# Patient Record
Sex: Male | Born: 1960 | Race: Black or African American | Hispanic: No | Marital: Married | Smoking: Never smoker
Health system: Southern US, Community
[De-identification: ages and names within clinical notes are randomized; demographics above are authoritative.]

## PROBLEM LIST (undated history)

## (undated) DIAGNOSIS — J302 Other seasonal allergic rhinitis: Secondary | ICD-10-CM

## (undated) DIAGNOSIS — F329 Major depressive disorder, single episode, unspecified: Secondary | ICD-10-CM

## (undated) DIAGNOSIS — F419 Anxiety disorder, unspecified: Secondary | ICD-10-CM

## (undated) DIAGNOSIS — M5430 Sciatica, unspecified side: Secondary | ICD-10-CM

## (undated) DIAGNOSIS — F32A Depression, unspecified: Secondary | ICD-10-CM

## (undated) DIAGNOSIS — Z86010 Personal history of colonic polyps: Principal | ICD-10-CM

## (undated) HISTORY — DX: Personal history of colonic polyps: Z86.010

## (undated) HISTORY — DX: Other seasonal allergic rhinitis: J30.2

## (undated) HISTORY — DX: Depression, unspecified: F32.A

## (undated) HISTORY — DX: Major depressive disorder, single episode, unspecified: F32.9

## (undated) HISTORY — DX: Sciatica, unspecified side: M54.30

## (undated) HISTORY — DX: Anxiety disorder, unspecified: F41.9

## (undated) HISTORY — PX: OTHER SURGICAL HISTORY: SHX169

---

## 2012-07-18 ENCOUNTER — Ambulatory Visit: Payer: Self-pay | Admitting: Nurse Practitioner

## 2013-07-28 ENCOUNTER — Telehealth: Payer: Self-pay | Admitting: Nurse Practitioner

## 2013-07-28 NOTE — Telephone Encounter (Signed)
Patient needs soon appt--having pain on rt foot going up her leg--please call-thank you.

## 2013-07-29 NOTE — Telephone Encounter (Signed)
Called pt to sched an appt with Hoyle Sauer, NP on 08/06/13.  I advised the pt that if he has any other problems, questions or concerns to call the office. Pt verbalized understanding.

## 2013-08-05 ENCOUNTER — Encounter: Payer: Self-pay | Admitting: Nurse Practitioner

## 2013-08-06 ENCOUNTER — Encounter: Payer: Self-pay | Admitting: Nurse Practitioner

## 2013-08-06 ENCOUNTER — Encounter (INDEPENDENT_AMBULATORY_CARE_PROVIDER_SITE_OTHER): Payer: Self-pay

## 2013-08-06 ENCOUNTER — Ambulatory Visit (INDEPENDENT_AMBULATORY_CARE_PROVIDER_SITE_OTHER): Payer: BC Managed Care – PPO | Admitting: Nurse Practitioner

## 2013-08-06 VITALS — BP 99/61 | HR 70 | Ht 67.5 in | Wt 168.0 lb

## 2013-08-06 DIAGNOSIS — G5720 Lesion of femoral nerve, unspecified lower limb: Secondary | ICD-10-CM

## 2013-08-06 DIAGNOSIS — R209 Unspecified disturbances of skin sensation: Secondary | ICD-10-CM

## 2013-08-06 DIAGNOSIS — R200 Anesthesia of skin: Secondary | ICD-10-CM | POA: Insufficient documentation

## 2013-08-06 DIAGNOSIS — G5700 Lesion of sciatic nerve, unspecified lower limb: Secondary | ICD-10-CM | POA: Insufficient documentation

## 2013-08-06 NOTE — Progress Notes (Signed)
I agree with the assessment and plan as directed by NP .The patient is known to me .   Jerlyn Pain, MD  

## 2013-08-06 NOTE — Progress Notes (Signed)
[facility]  PATIENT: Robert Benitez DOB: 1960/04/30   REASON FOR VISIT: follow up for right thigh pain   HISTORY OF PRESENT ILLNESS: Robert Benitez, 53 year old male returns for followup. He has a history of right thigh pain with a burning shooting quality. He was last seen in this office 04/18/2012 since that time his pain is intermittent and is more in the calf area on the right side now. Sometimes he has pain around his knee. He rubs his calf the pain goes away briefly but returns. 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 Mild spondylitic changes at L5-S1. EMG/[place] on 03/07/12 without evidence of large fiber peripheral neuropathy or right lumbosacral radiculopathy. He never had labs  done for further testing.  He did not find gabapentin to be beneficial however he never titrated the medication beyond 100 mg.He returns for reevaluation  HISTORY: He used to have an L4-5 radiculopathy on the right  six years ago and  this  attenuated  his patella reflex, but this now is different. He was diagnosed with sciatica.   Now, he has a numbness and tingling sensation in the right buttock and tingling to the leg to the lateral side of the foot, L6-S1.   There may be two nerves involved now.   He mentioned that during winter he has some spinal spasms as if radiating  downwards from nuchal area to the lower back.  A feeling of stiffness  resulted, and was getting better after he changed  to a firmer mattress.  04/18/12. Patient returns for followup.  He continues with  a numbness and tingling sensation in the right buttock and tingling to the leg to the lateral side of the right knee. He was on very low dose Gabapentin 100mg  without benefit , he stopped the drug. 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 Mild spondylitic  changes at L5-S1. EMG/[place] on 03/07/12 without evidence of large fiber peripheral neuropathy or right lumbosacral radiculopathy.     REVIEW OF SYSTEMS: Full 14 system review of systems performed and notable only for those listed, all others are neg:  Constitutional: N/A  Cardiovascular: N/A  Ear/Nose/Throat: N/A  Skin: N/A  Eyes: N/A  Respiratory: N/A  Gastroitestinal: N/A  Hematology/Lymphatic: N/A  Endocrine: N/A Musculoskeletal: Knee pain Allergy/Immunology: N/A  Neurological: Burning pain inner thigh and  right calf muscle Psychiatric: N/A Sleep : NA   ALLERGIES: No Known Allergies  HOME MEDICATIONS: Outpatient Prescriptions Prior to Visit  Medication Sig Dispense Refill  . B Complex-C (B-COMPLEX WITH VITAMIN C) tablet Take 1 tablet by mouth daily.      [name] gabapentin (NEURONTIN) 100 MG capsule Take 100 mg by mouth 2 (two) times daily.       No facility-administered medications prior to visit.    PAST MEDICAL HISTORY: Past Medical History  Diagnosis Date  . Depression   . Anxiety   . Sciatica     PAST SURGICAL HISTORY: Past Surgical History  Procedure Laterality Date  . None listed      FAMILY HISTORY: Family History  Problem Relation Age of Onset  . Asthma    . Diabetes    . Diabetes Father     SOCIAL HISTORY: History   Social History  . Marital Status: Married    Spouse Name: N/A    Number of Children: 3  .  Years of Education: 12+   Occupational History  . Counselor  [employer]   Social History Main Topics  . Smoking status: Never Smoker   . Smokeless tobacco: Never Used  . Alcohol Use: No  . Drug Use: No  . Sexual Activity: Not on file   Other Topics Concern  . Not on file   Social History Narrative   Patient is a [place] male married with 3 children.    Patient has a Master's degree.    Patient is a Social worker at [employer]            PHYSICAL EXAM  Filed Vitals:   08/06/13 0951  BP: 99/61  Pulse: 70  Height: 5' 7.5"  (1.715 m)  Weight: 168 lb (76.204 kg)   Body mass index is 25.91 kg/(m^2).  Generalized: Well developed, in no acute distress  Head: normocephalic and atraumatic,. Oropharynx benign  Neck: Supple, no carotid bruits  Cardiac: Regular rate rhythm, no murmur  Musculoskeletal: No deformity   Neurological examination   Mentation: Alert oriented to time, place, history taking. Follows all commands speech and language fluent  Cranial nerve II-XII: Pupils were equal round reactive to light extraocular movements were full, visual field were full on confrontational test. Facial sensation and strength were normal. hearing was intact to finger rubbing bilaterally. Uvula tongue midline. head turning and shoulder shrug were normal and symmetric.Tongue protrusion into cheek strength was normal. Motor: normal bulk and tone, full strength in the BUE, BLE, fine finger movements normal, No focal weakness Sensory: normal and symmetric to light touch, pinprick, and  vibration in the upper and lower extremities Coordination: finger-nose-finger, heel-to-shin bilaterally, no dysmetria Reflexes: Brachioradialis 2/2, biceps 2/2, triceps 2/2, patellar 2/2, Achilles 2/2, plantar responses were flexor bilaterally. Gait and Station: Rising up from seated position without assistance, normal stance,  moderate stride, good arm swing, smooth turning, able to perform tiptoe, and heel walking without difficulty. Tandem gait is steady, no assistive device  DIAGNOSTIC DATA (LABS, IMAGING, TESTING) -ASSESSMENT AND PLAN  53 y.o. 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 He also has a burning pain in his right calf.EMG and [facility]  on 03/07/12 without evidence of large fiber peripheral neuropathy or right lumbosacral radiculopathy.   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 Mild spondylitic changes at L5-S1. Patient never had labs to assess for neuropathy.   Will check labs today for neuropathy Followup in 3-6 months Robert Benitez, [facility], [facility], [facility] [address] [place], [place] [address] [phone]

## 2013-08-06 NOTE — Patient Instructions (Signed)
Will check labs today for neuropathy Followup in 3-6 months

## 2013-08-07 NOTE — Progress Notes (Signed)
I agree with the assessment and plan as directed by NP .The patient is known to me .   Briya Lookabaugh, MD  

## 2013-08-08 LAB — PROTEIN ELECTROPHORESIS
A/G Ratio: 1.4 (ref 0.7–2.0)
ALPHA 1: 0.2 g/dL (ref 0.1–0.4)
ALPHA 2: 0.6 g/dL (ref 0.4–1.2)
Albumin ELP: 3.9 g/dL (ref 3.2–5.6)
Beta: 1.1 g/dL (ref 0.6–1.3)
Gamma Globulin: 1 g/dL (ref 0.5–1.6)
Globulin, Total: 2.8 g/dL (ref 2.0–4.5)
TOTAL PROTEIN: 6.7 g/dL (ref 6.0–8.5)

## 2013-08-08 LAB — ANGIOTENSIN CONVERTING ENZYME: ANGIO CONVERT ENZYME: 53 U/L (ref 14–82)

## 2013-08-08 LAB — VITAMIN B12: VITAMIN B 12: 967 pg/mL — AB (ref 211–946)

## 2013-08-08 LAB — TSH: TSH: 0.82 u[IU]/mL (ref 0.450–4.500)

## 2013-08-08 LAB — SEDIMENTATION RATE: Sed Rate: 2 mm/hr (ref 0–30)

## 2013-08-08 LAB — VITAMIN D 25 HYDROXY (VIT D DEFICIENCY, FRACTURES): Vit D, 25-Hydroxy: 29.9 ng/mL — ABNORMAL LOW (ref 30.0–100.0)

## 2013-10-06 ENCOUNTER — Telehealth: Payer: Self-pay | Admitting: Nurse Practitioner

## 2013-10-06 NOTE — Telephone Encounter (Signed)
Pt requesting an earlier appointment with CM (9/30).  Pain has shifted from thigh to knee and below calf muscle.  Please call and advise.

## 2013-10-06 NOTE — Telephone Encounter (Signed)
Called pt and left message informing pt to call our office back to set up a sooner appt with Hoyle Sauer, NP.

## 2013-10-23 NOTE — Telephone Encounter (Signed)
Noted  

## 2013-11-03 ENCOUNTER — Ambulatory Visit (INDEPENDENT_AMBULATORY_CARE_PROVIDER_SITE_OTHER): Payer: BC Managed Care – PPO | Admitting: Nurse Practitioner

## 2013-11-03 ENCOUNTER — Encounter: Payer: Self-pay | Admitting: Nurse Practitioner

## 2013-11-03 VITALS — BP 108/62 | HR 72 | Ht 67.5 in | Wt 173.8 lb

## 2013-11-03 DIAGNOSIS — R200 Anesthesia of skin: Secondary | ICD-10-CM

## 2013-11-03 DIAGNOSIS — G5700 Lesion of sciatic nerve, unspecified lower limb: Secondary | ICD-10-CM

## 2013-11-03 DIAGNOSIS — G5701 Lesion of sciatic nerve, right lower limb: Secondary | ICD-10-CM

## 2013-11-03 DIAGNOSIS — R209 Unspecified disturbances of skin sensation: Secondary | ICD-10-CM

## 2013-11-03 NOTE — Progress Notes (Addendum)
[facility]  PATIENT: Karon Lukehart DOB: 1960/07/15   REASON FOR VISIT: Followup for right thigh pain    HISTORY OF PRESENT ILLNESS: Mr.Iorio, 53 year old male returns for followup. He has a history of right thigh pain with a burning shooting quality. When last seen 08/06/2013 neuropathy labs were done, all were normal except mildly low vitamin D level. He reports today that his thigh pain is gone and now it is more in the right lower calf that comes and goes. It hasn't been a problem in the last several weeks. He is not sure if he is taking vitamin D in his multivitamin or  not.   HISTORY:Mr. Emma, 53 year old male returns for followup. He has a history of right thigh pain with a burning shooting quality. He was last seen in this office 04/18/2012 since that time his pain is intermittent and is more in the calf area on the right side now. Sometimes he has pain around his knee. He rubs his calf the pain goes away briefly but returns. 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 Mild spondylitic changes at L5-S1. EMG/[place] on 03/07/12 without evidence of large fiber peripheral neuropathy or right lumbosacral radiculopathy. He never had labs done for further testing. He did not find gabapentin to be beneficial however he never titrated the medication beyond 100 mg.He returns for reevaluation   He used to have an L4-5 radiculopathy on the right six years ago and this attenuated his patella reflex, but this now is different. He was diagnosed with sciatica.  Now, he has a numbness and tingling sensation in the right buttock and tingling to the leg to the lateral side of the foot, L6-S1. There may be two nerves involved now.  He mentioned that during winter he has some spinal spasms as if radiating downwards from nuchal area to the lower back. A feeling of stiffness resulted, and was getting  better after he changed to a firmer mattress.  04/18/12. Patient returns for followup. He continues with a numbness and tingling sensation in the right buttock and tingling to the leg to the lateral side of the right knee. He was on very low dose Gabapentin 100mg  without benefit , he stopped the drug. 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 Mild spondylitic changes at L5-S1. EMG/[place] on 03/07/12 without evidence of large fiber peripheral neuropathy or right lumbosacral radiculopathy.      REVIEW OF SYSTEMS: Full 14 system review of systems performed and notable only for those listed, all others are neg:  Constitutional: N/A  Cardiovascular: N/A  Ear/Nose/Throat: N/A  Skin: N/A  Eyes: N/A  Respiratory: N/A  Gastroitestinal: N/A  Hematology/Lymphatic: N/A  Endocrine: N/A Musculoskeletal:N/A  Allergy/Immunology: N/A  Neurological: N/A Psychiatric: N/A Sleep : NA   ALLERGIES: No Known Allergies  HOME MEDICATIONS: Outpatient Prescriptions Prior to Visit  Medication Sig Dispense Refill  . B Complex-C (B-COMPLEX WITH VITAMIN C) tablet Take 1 tablet by mouth daily.       No facility-administered medications prior to visit.    PAST MEDICAL HISTORY: Past Medical History  Diagnosis Date  . Depression   . Anxiety   . Sciatica     PAST SURGICAL HISTORY: Past Surgical History  Procedure Laterality Date  . None listed      FAMILY HISTORY: Family History  Problem Relation Age of Onset  .  Asthma    . Diabetes    . Diabetes Father     SOCIAL HISTORY: History   Social History  . Marital Status: Married    Spouse Name: N/A    Number of Children: 3  . 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 Smoking status: Never Smoker   . Smokeless tobacco: Never Used  . Alcohol Use: No  . Drug Use: No  . Sexual Activity: Not  on file   Other Topics Concern  . Not on file   Social History Narrative   Patient is a [place] male married with 3 children.    Patient has a Master's degree.    Patient is a Social worker at [employer]            PHYSICAL EXAM  Filed Vitals:   11/03/13 1530  BP: 108/62  Pulse: 72  Height: 5' 7.5" (1.715 m)  Weight: 173 lb 12.8 oz (78.835 kg)   Body mass index is 26.8 kg/(m^2). Generalized: Well developed, in no acute distress  Musculoskeletal: No deformity  Neurological examination  Mentation: Alert oriented to time, place, history taking. Follows all commands speech and language fluent  Cranial nerve II-XII: Pupils were equal round reactive to light extraocular movements were full, visual field were full on confrontational test. Facial sensation and strength were normal. hearing was intact to finger rubbing bilaterally. Uvula tongue midline. head turning and shoulder shrug were normal and symmetric.Tongue protrusion into cheek strength was normal.  Motor: normal bulk and tone, full strength in the BUE, BLE, fine finger movements normal, No focal weakness  Sensory: normal and symmetric to light touch, pinprick, and vibration in the upper and lower extremities  Coordination: finger-nose-finger, heel-to-shin bilaterally, no dysmetria  Reflexes: Brachioradialis 2/2, biceps 2/2, triceps 2/2, patellar 2/2, Achilles 2/2, plantar responses were flexor bilaterally.  Gait and Station: Rising up from seated position without assistance, normal stance, moderate stride, good arm swing, smooth turning, able to perform tiptoe, and heel walking without difficulty. Tandem gait is steady, no assistive device     FDIAGNOSTIC DATA (LABS, IMAGING, TESTING) - I reviewed patient records, labs, notes, testing and imaging myself where available.      Component Value Date/Time   PROT 6.7 08/06/2013 1040    Lab Results  Component Value Date   [id number] 287* 08/06/2013   Lab Results  Component Value Date    TSH 0.820 08/06/2013      ASSESSMENT AND PLAN  53 y.o. year old male  has a past medical history of Depression; Anxiety; and Sciatica. here to followup. His right back pain is much better but now has intermittently pain  in the right calf.  Vit D 1000 IU daily No follow up necessary with MD - C. Dohmeier, MD  Exercise by walking daily for overall general health Patient has tried gabapentin in the past and does not want to be on any daily medication. [name], [facility], [facility], APRN  [facility] [address] [place], [place] [address] [phone]

## 2013-11-03 NOTE — Patient Instructions (Signed)
Vit D 1000 IU daily F/U in 6 months Exercise by walking daily for overall general health

## 2013-12-17 ENCOUNTER — Ambulatory Visit: Payer: BC Managed Care – PPO | Admitting: Nurse Practitioner

## 2014-04-27 ENCOUNTER — Telehealth: Payer: Self-pay

## 2014-04-27 NOTE — Telephone Encounter (Signed)
Called to r/s appt on 05/06/14, due to change in provider's schedule. Patient verbalized understanding. Patient stated he was currently with a student and would have to call back to reschedule.

## 2014-05-06 ENCOUNTER — Ambulatory Visit: Payer: BC Managed Care – PPO | Admitting: Nurse Practitioner

## 2014-05-15 ENCOUNTER — Encounter: Payer: Self-pay | Admitting: Neurology

## 2014-05-15 ENCOUNTER — Ambulatory Visit (INDEPENDENT_AMBULATORY_CARE_PROVIDER_SITE_OTHER): Payer: BLUE CROSS/BLUE SHIELD | Admitting: Neurology

## 2014-05-15 VITALS — BP 116/71 | HR 75 | Resp 14 | Ht 67.0 in | Wt 172.0 lb

## 2014-05-15 DIAGNOSIS — M47896 Other spondylosis, lumbar region: Secondary | ICD-10-CM

## 2014-05-15 DIAGNOSIS — R208 Other disturbances of skin sensation: Secondary | ICD-10-CM | POA: Diagnosis not present

## 2014-05-15 NOTE — Progress Notes (Signed)
[facility]  PATIENT: Robert Benitez DOB: Dec 15, 1960   REASON FOR VISIT: Followup for right thigh pain    HISTORY OF PRESENT ILLNESS: Robert Benitez, 54 year old male returns for followup. He has a history of right thigh pain with a burning shooting quality. When last seen 08/06/2013 neuropathy labs were done, all were normal except mildly low vitamin D level. He reports today that his thigh pain is gone and now it is more in the right lower calf that comes and goes. It hasn't been a problem in the last several weeks. He is not sure if he is taking vitamin D in his multivitamin or  not.   HISTORY:Robert Benitez, 54 year old male returns for followup. He has a history of right thigh pain with a burning shooting quality. He was last seen in this office 04/18/2012 since that time his pain is intermittent and is more in the calf area on the right side now. Sometimes he has pain around his knee. He rubs his calf the pain goes away briefly but returns. 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 Mild spondylitic changes at L5-S1. EMG/[place] on 03/07/12 without evidence of large fiber peripheral neuropathy or right lumbosacral radiculopathy. He never had labs done for further testing. He did not find gabapentin to be beneficial however he never titrated the medication beyond 100 mg.He returns for reevaluation   He used to have an L4-5 radiculopathy on the right six years ago and this attenuated his patella reflex, but this now is different. He was diagnosed with sciatica.  Now, he has a numbness and tingling sensation in the right buttock and tingling to the leg to the lateral side of the foot, L6-S1. There may be two nerves involved now.  He mentioned that during winter he has some spinal spasms as if radiating downwards from nuchal area to the lower back. A feeling of stiffness resulted, and was getting  better after he changed to a firmer mattress.  04/18/12. Patient returns for followup. He continues with a numbness and tingling sensation in the right buttock and tingling to the leg to the lateral side of the right knee. He was on very low dose Gabapentin 100mg  without benefit , he stopped the drug. 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 Mild spondylitic changes at L5-S1. EMG/[place] on 03/07/12 without evidence of large fiber peripheral neuropathy or right lumbosacral radiculopathy.  His last visit concluded as follows:  ASSESSMENT AND PLAN  54 y.o. year old male  has a past medical history of Depression; Anxiety; and Sciatica. here to followup.  His right back pain is much better but now has intermittently pain  in the right calf. Vit D 1000 IU daily No follow up necessary with MD - C. Javonte Elenes, MD  Exercise by walking daily for overall general health Patient has tried gabapentin in the past and does not want to be on any daily medication.  Patient requested new Visit with a new symptom, 05-15-14,   Robert Benitez is seen here today in a follow-up for a development of dysesthesia. He was tested about 2 years ago by EMG and nerve conduction studies which returned negative for large fiber peripheral neuropathy or right lumbosacral radiculopathy he had an MRI of the lumbar spine which showed only moderate spondylitic changes at L4-5 the most commonly affected levels. He had bilaminar  bilateral foraminal stenosis but no root impingement.Robert Benitez He now has a new symptom that is concerning him very much. He describes that there is an anterior medial patch at the left inner ankle that gives him a burning sensation. He has not found that exposure, time of day, fatigability place a role in the onset it seems to affect him if he was on his feet a lot walking or standing or even when he is at rest. It is unaffected by mealtimes or time  of day it is even present when he is asleep and otherwise completely at rest. Reports a crescent or horseshoe-shaped area alongside the upper kneecap that can be burning like a low-grade painful sensation. Has never radiated upwards there is another area which we discussed in the previous visit of pain traveling along the groin from the pelvic crest down the right groin towards the midline. The knee pain area is most painful when he exercises and stretches/ flexes at the knee . He can comfortably stand on either leg, he ran the marathon at valentine's day , only 5 K, but for a week felt generalized muscle. He wore support stocking for 14 days , has seen no difference/ in pain or soreness.  He has has sciatica in the past, not affecting him now. He lost the radiculopathic L4-5 pain a while back.    I suspect this could be the  saphenous nerve ? He has no edema, no rash, no cyanosis or coldness. There is no asymmetry in muscle bulk or bony structure of either leg. His knees are of the same size he does have some scars on the anterior shin but there is no edema cyanosis no coldness his skin is slightly dry. He gained some weight, but he has been exercising regularly.    REVIEW OF SYSTEMS: Full 14 system review of systems performed and notable only for those listed, all others are neg:  See above. He has endorsed a lot of work place stress.  He is on a [employer]. He has 3 kids in school.    ALLERGIES: No Known Allergies  HOME MEDICATIONS: Outpatient Prescriptions Prior to Visit  Medication Sig Dispense Refill  . Ascorbic Acid (VITAMIN C) 500 MG CAPS Take by mouth.    . B Complex-C (B-COMPLEX WITH VITAMIN C) tablet Take 1 tablet by mouth daily.    . Multiple Vitamin (THERA) TABS Take 1 tablet by mouth.     No facility-administered medications prior to visit.    PAST MEDICAL HISTORY: Past Medical History  Diagnosis Date  . Depression   . Anxiety   . Sciatica     PAST SURGICAL  HISTORY: Past Surgical History  Procedure Laterality Date  . None listed      FAMILY HISTORY: Family History  Problem Relation Age of Onset  . Asthma    . Diabetes    . Diabetes Father     SOCIAL HISTORY: History   Social History  . Marital Status: Married    Spouse Name: N/A  . Number of Children: 3  . Years of Education: 12+   Occupational History  . Counselor  [employer]   Social History Main Topics  . Smoking status: Never Smoker   . Smokeless tobacco: Never Used  . Alcohol Use: No  . Drug Use: No  . Sexual Activity: Not on file   Other Topics Concern  . Not on file   Social History Narrative   Patient is a [place]  male married with 3 children. Patient wife Beverlee Nims    Patient has a Master's degree.    Patient is a Social worker at [employer]            PHYSICAL EXAM  Filed Vitals:   05/15/14 1023  BP: 116/71  Pulse: 75  Resp: 14  Height: 5\' 7"  (1.702 m)  Weight: 172 lb (78.019 kg)   Body mass index is 26.93 kg/(m^2). Generalized: Well developed, in no acute distress  Musculoskeletal: No deformity  Neurological examination  Mentation: Alert oriented to time, place, history taking. Follows all commands speech and language fluent  Cranial nerve :  Pupils were equal round reactive to light extraocular movements were full, visual field were full on confrontational test. Facial sensation and strength were normal. hearing was intact to finger rubbing bilaterally. Uvula tongue midline. head turning and shoulder shrug were normal and symmetric.Tongue protrusion into cheek strength was normal.  Motor: normal bulk and tone, full strength in all extremities, fine finger movements normal, No focal weakness  Sensory: normal and symmetric to light touch, pinprick, and vibration in the upper and lower extremities  Coordination: finger-nose-finger, heel-to-shin bilaterally, no dysmetria  Reflexes: 2/2, plantar responses were flexor bilaterally.  Gait and Station: Rising  up from seated position without assistance, normal stance, moderate stride, good arm swing, smooth turning, able to perform tiptoe, and heel walking without difficulty. Tandem gait is steady, no assistive device . He can stand on either leg for 10 sec.     FDIAGNOSTIC DATA (LABS, IMAGING, TESTING) - I reviewed patient records, labs, notes, testing and imaging myself where available.   quoted EMG/ [facility] above     Component Value Date/Time   PROT 6.7 08/06/2013 1040    Lab Results  Component Value Date   [id number] 878* 08/06/2013   Lab Results  Component Value Date   TSH 0.820 08/06/2013      Dr. Kelby Fam somatic manifestations have evolved slightly since his last visit. Considering that about 6 or 7 years ago he had lost his patella reflex and was diagnosed with sciatica he seems to have done well in terms of that part of his neurologic manifestations. In January 2014 there was no evidence of large fiber peripheral neuropathy or lumbosacral radiculopathy. However this may have changed he has 3 distinctive regions of dysesthesias the groin the superior and medial knee Area and the dorsal pedis around the ankle medial and frontal. All these are and branches of the L4-L5 region. I think it would be worthwhile for this patient to look at a new EMG and nerve conduction study to make sure that no large fiber involvement is seen now.  I will request a paraspinal EMG from my neuromuscular colleagues.   I will refer him to  A male [facility] , either Dr. Leta Baptist or Dr. Jannifer Franklin was EMG and nerve conduction study. As treatment options we have gabapentin for neuropathy, Lyrica, or topical numbing creams or ointments.  I do think that he is physically very active for his numeric age and that this does not constitute at condition that is degenerative, progressive, or related to any malignancy. He wil take Ibuprofen for now.    [facility] [address], [place], [place]  [address] [phone]

## 2014-06-01 ENCOUNTER — Ambulatory Visit (INDEPENDENT_AMBULATORY_CARE_PROVIDER_SITE_OTHER): Payer: BLUE CROSS/BLUE SHIELD | Admitting: Neurology

## 2014-06-01 ENCOUNTER — Ambulatory Visit (INDEPENDENT_AMBULATORY_CARE_PROVIDER_SITE_OTHER): Payer: Self-pay | Admitting: Neurology

## 2014-06-01 DIAGNOSIS — R202 Paresthesia of skin: Secondary | ICD-10-CM | POA: Diagnosis not present

## 2014-06-01 DIAGNOSIS — M47896 Other spondylosis, lumbar region: Secondary | ICD-10-CM

## 2014-06-01 DIAGNOSIS — R208 Other disturbances of skin sensation: Secondary | ICD-10-CM

## 2014-06-04 NOTE — Procedures (Signed)
  [facility]    Provider:  Dr Jaynee Eagles Referring Provider: Beam, Russ Halo, PHD Primary Care Physician:  BEAM, Russ Halo, PHD  HPI:  Robert Benitez is a 54 y.o. male here as a referral from Dr. Shelda Pal for right leg pain. He has had right-sided sciatica for 20 years with radiation down the back of the leg to the lateral right lower leg. However his current concern is burning pain that radiates to the right anterior thigh area and into the groin. The worse pain is focused around the knee in a "horse-shoe" distribution. These symptoms have been progressing for 3 years but more recently his thigh is "a little quieter". In addition, 8 months ago he noticed burning in the medial right lower leg.   Summary  Nerve conduction studies were performed on the lower extremities:  The left Peroneal motor nerve showed normal conductions with normal F Wave latency The right Peroneal motor nerve showed  conduction block (>50% decrease in amplitude with proximal conduction as compared to the distal amplitudes) with normal F Wave latency The bilateral Tibial motor nerves showed normal conductions with normal F Wave latencies The bilateral Sural, Superficial Peroneal, and Saphenous sensory nerves were within normal limits Bilateral H Reflexes showed normal latencies  EMG Needle study was performed on selected right lower extremity muscles and paraspinal muscles:   The  Iliopsoas, Adductor Magnus,Vastus Medialis, Anterior Tibialis, Medial Gastrocnemius, Peroneus Longus, Extensor Hallucis Longus, Abductor Hallucis, Biceps Femoris (long and short heads), Gluteus Medius, Gluteus Maximus muscles were within normal limits. The right L3, L4, L5, S1 paraspinal muscles were within normal limits.  Conclusion:   1. No electrophysiologic findings to explain his right anterior thigh sensations or his right medial lower leg paresthesias. No evidence for acute/ongoing radiculopathy, plexopathy or polyneuropathy.    2. 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 However, this does not explain his clinical symptoms and so appears to be more of an incidental finding. The relative decrease in right peroneal motor amplitude potential as compared to the left side could be  consistent with his remote history of sciatica.  3. Clinical correlation recommended.    Sarina Ill, MD  [facility] [address] [place], [place] [address]  Phone [phone] Fax [phone]

## 2014-06-04 NOTE — Progress Notes (Signed)
  [facility]    Provider:  Dr Jaynee Eagles Referring Provider: Beam, Russ Halo, PHD Primary Care Physician:  BEAM, Russ Halo, PHD  HPI:  Robert Benitez is a 54 y.o. male here as a referral from Dr. Shelda Pal for right leg pain. He has had right-sided sciatica for 20 years with radiation down the back of the leg to the lateral right lower leg. However his current concern is burning pain that radiates to the right anterior thigh area and into the groin. The worse pain is focused around the knee in a "horse-shoe" distribution. These symptoms have been progressing for 3 years but more recently his thigh is "a little quieter". In addition, 8 months ago he noticed burning in the medial right lower leg.   Summary  Nerve conduction studies were performed on the lower extremities:  The left Peroneal motor nerve showed normal conductions with normal F Wave latency The right Peroneal motor nerve showed  conduction block (>50% decrease in amplitude with proximal conduction as compared to the distal amplitudes) with normal F Wave latency The bilateral Tibial motor nerves showed normal conductions with normal F Wave latencies The bilateral Sural, Superficial Peroneal, and Saphenous sensory nerves were within normal limits Bilateral H Reflexes showed normal latencies  EMG Needle study was performed on selected right lower extremity muscles and paraspinal muscles:   The  Iliopsoas, Adductor Magnus,Vastus Medialis, Anterior Tibialis, Medial Gastrocnemius, Peroneus Longus, Extensor Hallucis Longus, Abductor Hallucis, Biceps Femoris (long and short heads), Gluteus Medius, Gluteus Maximus muscles were within normal limits. The right L3, L4, L5, S1 paraspinal muscles were within normal limits.  Conclusion:   1. No electrophysiologic findings to explain his right anterior thigh sensations or his right medial lower leg paresthesias. No evidence for acute/ongoing radiculopathy, plexopathy or polyneuropathy.    2. 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 However, this does not explain his clinical symptoms and so appears to be more of an incidental finding. The relative decrease in right peroneal motor amplitude potential as compared to the left side could be  consistent with his remote history of sciatica.  3. Clinical correlation recommended.    Sarina Ill, MD  [facility] [address] [place], [place] [address]  Phone [phone] Fax [phone]

## 2014-06-04 NOTE — Progress Notes (Signed)
See procedure note.

## 2014-06-05 NOTE — Progress Notes (Signed)
I agree with the assessment and plan as directed by NP .The patient is known to me .   Keaun Schnabel, MD  

## 2014-06-09 NOTE — Progress Notes (Signed)
Quick Note:  LMVM for pt re: result of EMG/NCS (result note). Pt to call back if questions. Please sign up for mychart for copy of results. ______

## 2014-06-18 ENCOUNTER — Ambulatory Visit: Payer: BLUE CROSS/BLUE SHIELD | Admitting: Adult Health

## 2014-06-18 ENCOUNTER — Ambulatory Visit (INDEPENDENT_AMBULATORY_CARE_PROVIDER_SITE_OTHER): Payer: BC Managed Care – PPO | Admitting: Nurse Practitioner

## 2014-06-18 ENCOUNTER — Encounter: Payer: Self-pay | Admitting: Nurse Practitioner

## 2014-06-18 VITALS — BP 107/70 | HR 79 | Ht 67.0 in | Wt 175.8 lb

## 2014-06-18 DIAGNOSIS — R200 Anesthesia of skin: Secondary | ICD-10-CM

## 2014-06-18 DIAGNOSIS — R208 Other disturbances of skin sensation: Secondary | ICD-10-CM | POA: Diagnosis not present

## 2014-06-18 NOTE — Patient Instructions (Signed)
Gabapentin 100 mg 2 capsules at night for one week then increase to 3 capsules at night. This medication is for nerve pain Follow-up in 6 months

## 2014-06-18 NOTE — Progress Notes (Signed)
[facility]  PATIENT: Robert Benitez DOB: January 21, 1961   REASON FOR VISIT: Follow-up for right leg pain, dysesthesias of the right ankle, right groin pain HISTORY FROM: Patient    HISTORY OF PRESENT ILLNESS:Robert Benitez, 54 year old male returns for followup. He was last seen by Dr. Brett Fairy 05/15/2014 He has a history of right thigh pain with a burning shooting quality. Neuropathy labs were done, all were normal except mildly low vitamin D level. He reports today that his thigh pain has returned in the right lower calf pain comes and goes. He is not currently having ankle pain. He also has some knee pain which is most painful when he exercises or stretches and flexes the knee. When last seen by Dr. Brett Fairy he was also having groin pain on the right. His discomfort is a burning sensation that comes and goes . He is not taking any medications currently for nerve pain. He returns for reevaluation EMG nerve conduction done 06/01/2014 was essentially normal ,results were reviewed with patient.  HISTORY:Robert Benitez, 54 year old male returns for followup. He has a history of right thigh pain with a burning shooting quality. He was last seen in this office 04/18/2012 since that time his pain is intermittent and is more in the calf area on the right side now. Sometimes he has pain around his knee. He rubs his calf the pain goes away briefly but returns. 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 Mild spondylitic changes at L5-S1. EMG/[place] on 03/07/12 without evidence of large fiber peripheral neuropathy or right lumbosacral radiculopathy.  He did not find gabapentin to be beneficial however he never titrated the medication beyond 100 mg.He returns for reevaluation  He used to have an L4-5 radiculopathy on the right six years ago and this attenuated his patella reflex, but this now is different. He was  diagnosed with sciatica.  Now, he has a numbness and tingling sensation in the right buttock and tingling to the leg to the lateral side of the foot, L6-S1. There may be two nerves involved now.  He mentioned that during winter he has some spinal spasms as if radiating downwards from nuchal area to the lower back. A feeling of stiffness resulted, and was getting better after he changed to a firmer mattress.  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 Mild spondylitic changes at L5-S1. EMG/ on 03/07/12 without evidence of large fiber peripheral neuropathy or right lumbosacral radiculopathy.   REVIEW OF SYSTEMS: Full 14 system review of systems performed and notable only for those listed, all others are neg:  Constitutional: neg  Cardiovascular: neg Ear/Nose/Throat: neg  Skin: neg Eyes: neg Respiratory: neg Gastroitestinal: neg  Hematology/Lymphatic: neg  Endocrine: neg Musculoskeletal:neg Allergy/Immunology: neg Neurological: neg Psychiatric: neg Sleep : neg   ALLERGIES: No Known Allergies  HOME MEDICATIONS: Outpatient Prescriptions Prior to Visit  Medication Sig Dispense Refill  . Ascorbic Acid (VITAMIN C) 500 MG CAPS Take by mouth.    . B Complex-C (B-COMPLEX WITH VITAMIN C) tablet Take 1 tablet by mouth daily.    . Multiple Vitamin (THERA) TABS Take 1 tablet by mouth.     No facility-administered medications prior to visit.    PAST MEDICAL HISTORY: Past Medical History  Diagnosis Date  . Depression   . Anxiety   . Sciatica     PAST SURGICAL HISTORY: Past Surgical  History  Procedure Laterality Date  . None listed      FAMILY HISTORY: Family History  Problem Relation Age of Onset  . Asthma    . Diabetes    . Diabetes Father     SOCIAL HISTORY: History   Social History  . Marital Status: Married    Spouse Name: N/A  . Number of Children: 3  . Years of Education: 12+     Occupational History  . Counselor  [employer]   Social History Main Topics  . Smoking status: Never Smoker   . Smokeless tobacco: Never Used  . Alcohol Use: No  . Drug Use: No  . Sexual Activity: Not on file   Other Topics Concern  . Not on file   Social History Narrative   Patient is a [place] male married with 3 children. Patient wife Beverlee Nims    Patient has a Master's degree.    Patient is a Social worker at [employer]            PHYSICAL EXAM  Filed Vitals:   06/18/14 0801  Height: 5\' 7"  (1.702 m)  Weight: 175 lb 12.8 oz (79.742 kg)   Body mass index is 27.53 kg/(m^2). Generalized: Well developed, in no acute distress  Musculoskeletal: No deformity  Neurological examination  Mentation: Alert oriented to time, place, history taking. Follows all commands speech and language fluent  Cranial nerve : Pupils were equal round reactive to light extraocular movements were full, visual field were full on confrontational test. Facial sensation and strength were normal. hearing was intact to finger rubbing bilaterally. Uvula tongue midline. head turning and shoulder shrug were normal and symmetric.Tongue protrusion into cheek strength was normal.  Motor: normal bulk and tone, full strength in all extremities, fine finger movements normal, No focal weakness  Sensory: normal and symmetric to light touch, pinprick, and vibration in the upper and lower extremities except mildly decreased pinprick on the right lower extremity Coordination: finger-nose-finger, heel-to-shin bilaterally, no dysmetria  Reflexes: 2/2, plantar responses were flexor bilaterally.  Gait and Station: Rising up from seated position without assistance, normal stance, moderate stride, good arm swing, smooth turning, able to perform tiptoe, and heel walking without difficulty. Tandem gait is steady, no assistive device .He can stand on either leg for 10 sec.   DIAGNOSTIC DATA (LABS, IMAGING,  TESTING) -  ASSESSMENT AND PLAN  54 y.o. year old male  has a past medical history of dysesthesias of the groin, of the superior and medial knee also the dorsal pedis around the ankle medial and frontal. EMG nerve conduction performed 06/01/2014 was essentially normal. He is continuing with these dysesthesias and is requesting medication to quiet the burning sensation  Try Gabapentin 100 mg 2 capsules at night for one week then increase to 3 capsules at night. This medication is for nerve pain Call for worsening symptoms Other medications that can be used are Lyrica or topical creams Follow-up in 6 months [name], [facility], [facility], [facility] [address], [place], [place] [address] [phone]

## 2014-06-18 NOTE — Progress Notes (Signed)
I agree with the assessment and plan as directed by NP .The patient is known to me .   Dezarae Mcclaran, MD  

## 2014-12-18 ENCOUNTER — Ambulatory Visit: Payer: BC Managed Care – PPO | Admitting: Nurse Practitioner

## 2014-12-22 ENCOUNTER — Ambulatory Visit: Payer: BC Managed Care – PPO | Admitting: Nurse Practitioner

## 2016-07-12 ENCOUNTER — Ambulatory Visit (INDEPENDENT_AMBULATORY_CARE_PROVIDER_SITE_OTHER): Payer: BC Managed Care – PPO | Admitting: Osteopathic Medicine

## 2016-07-12 ENCOUNTER — Encounter: Payer: Self-pay | Admitting: Osteopathic Medicine

## 2016-07-12 VITALS — BP 106/72 | HR 80 | Ht 67.0 in | Wt 176.0 lb

## 2016-07-12 DIAGNOSIS — M79604 Pain in right leg: Secondary | ICD-10-CM | POA: Diagnosis not present

## 2016-07-12 DIAGNOSIS — Z0000 Encounter for general adult medical examination without abnormal findings: Secondary | ICD-10-CM

## 2016-07-12 MED ORDER — MELOXICAM 7.5 MG PO TABS: 7.5000 mg | ORAL_TABLET | Freq: Every day | ORAL | 1 refills | Status: DC

## 2016-07-12 NOTE — Progress Notes (Signed)
HPI: Robert Benitez is a 56 y.o. male  who presents to [facility] today, 07/12/16,  for chief complaint of:  Chief Complaint  Patient presents with  . Establish Care    RIGHT LEG PAIN    Leg Pain  . Context: No hx injury to back/hip/knee/ankle on R. Previous w/u w/ EMG and MRI negative for explanation. Symptoms are not debilitating - he is a bit frustrated that neurology was just trying to give medications without really giving him a reason for symptoms.  . Location: R leg around thing/groin, ina  Semicircle above patella, and medical lower leg anterior to gastrocnemius  . Quality: tightness feeling, occasional burning/heat feeling . Severity: mild . Duration: years . Timing: intermittent, few days of the week . Modifying factors: Gabapentin tried and not helpful, OTC NSAID help some  Review of records from neurology visit 10/2013: "He had MRI of the lumbar spine shows moderate spondylitic changes at L4-L5 with bilateral foraminal stenosis but no root impingement. Mild spondylitic changes at L5-S1. EMG/ on 03/07/12 without evidence of large fiber peripheral neuropathy or right lumbosacral radiculopathy. He never had labs done for further testing. He did not find gabapentin to be beneficial however he never titrated the medication beyond 100 mg." A/P recommends walking for exercise and f/u 6 mos. Visit 05/2014 - gabapentin recommended, mentioned other emds such as Lyrica or topical treatment  Repeat EMG 05/2014: "1. No electrophysiologic findings to explain his right anterior thigh sensations or his right medial lower leg paresthesias. No evidence for acute/ongoing radiculopathy, plexopathy or polyneuropathy.  2. 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 However, this does not explain his clinical symptoms and so appears to be more of an incidental finding. The relative decrease in right peroneal motor amplitude potential as compared to the  left side could be  consistent with his remote history of sciatica.  3. Clinical correlation recommended."     Past medical, surgical, social and family history reviewed: Patient Active Problem List   Diagnosis Date Noted  . Other osteoarthritis of spine, lumbar region 05/15/2014  . Dysesthesia of multiple sites 05/15/2014  . Lesion of sciatic nerve 08/06/2013  . Other lesion of femoral nerve 08/06/2013  . Lower extremity numbness 08/06/2013   Past Surgical History:  Procedure Laterality Date  . None listed     Social History  Substance Use Topics  . Smoking status: Never Smoker  . Smokeless tobacco: Never Used  . Alcohol use No   Family History  Problem Relation Age of Onset  . Diabetes Father   . Asthma    . Diabetes       Current medication list and allergy/intolerance information reviewed:   Current Outpatient Prescriptions  Medication Sig Dispense Refill  . Multiple Vitamin (THERA) TABS Take 1 tablet by mouth.     No current facility-administered medications for this visit.    No Known Allergies    Review of Systems:  Constitutional:  No  fever, no chills, No recent illness, No unintentional weight changes. No significant fatigue.   HEENT: No  headache, no vision change  Cardiac: No  chest pain, No  pressure, No palpitations, No  Orthopnea  Respiratory:  No  shortness of breath. No  Cough  Gastrointestinal: No  abdominal pain, No  nausea,  Musculoskeletal: No new myalgia/arthralgia  Skin: No  Rash, No other wounds/concerning lesions  Hem/Onc: No  easy bruising/bleeding, node  Endocrine: No cold intolerance,  No heat intolerance.  Neurologic: No  weakness, No  dizziness, No  slurred speech/focal weakness/facial droop  Psychiatric: No  concerns with depression, No  concerns with anxiety, No sleep problems, No mood problems  Exam:  BP 106/72   Pulse 80   Ht 5\' 7"  (1.702 m)   Wt 176 lb (79.8 kg)   BMI 27.57 kg/m   Constitutional: VS see  above. General Appearance: alert, well-developed, well-nourished, NAD  Eyes: Normal lids and conjunctive, non-icteric sclera  Ears, Nose, Mouth, Throat: MMM, Normal external inspection ears/nares/mouth/lips/gums.   Neck: No masses, trachea midline.  Respiratory: Normal respiratory effort. no wheeze, no rhonchi, no rales  Cardiovascular: S1/S2 normal, no murmur, no rub/gallop auscultated. RRR. No lower extremity edema. Pedal pulse II/IV bilaterally DP and PT.   Gastrointestinal: Nontender, no masses.   Musculoskeletal: Fairly symmetric posture. No lower back tenderness to palpation, normal lumbar ROM. Gait normal. No LE edema, neg Homan's bilaterally. Strength 5/5 and equal bilaterally to hip flex/ext, knee flex/ext, ankle plantar/dorsiflexion and inversion/eversion. No crepitus to knee on R. Normal FABER. Neg McMurrays. Neg ant/post drawer.   Neurological: Normal balance/coordination. No tremor. No cranial nerve deficit on limited exam. Motor and sensation intact and symmetric. Cerebellar reflexes intact.   Skin: warm, dry, intact. No rash/ulcer. No concerning nevi or subq nodules on limited exam.    Psychiatric: Normal judgment/insight. Normal mood and affect. Oriented x3.     ASSESSMENT/PLAN:   Right leg pain - Not sure what to make of his symptoms, distribution/character not c/w nerve impingement, no weakness/sciatica - would get 2nd opinion from sports med/PT - longstanding issue and no alarm symptoms for urgent imaging or referral.   Annual physical exam - Orders placed fr future visit - preventive care not performed or billed today. Plan: CBC with Differential/Platelet, COMPLETE METABOLIC PANEL WITH GFR, Lipid panel, TSH, VITAMIN D 25 Hydroxy (Vit-D Deficiency, Fractures), Hepatitis C antibody, HIV antibody    Patient Instructions  Plan: Second opinion on leg pain from sports medicine - Dr. Georgina Snell or Dr. Darene Lamer - can schedule this for same day as your annual wellness physical.      Visit summary with medication list and pertinent instructions was printed for patient to review. All questions at time of visit were answered - patient instructed to contact office with any additional concerns. ER/RTC precautions were reviewed with the patient. Follow-up plan: Return for second opinion w/ sports med, annual physical w/ Dr Sheppard Coil .  Note: Total time spent 30 minutes, greater than 50% of the visit was spent face-to-face counseling and coordinating care for the following: The primary encounter diagnosis was Right leg pain.

## 2016-07-12 NOTE — Patient Instructions (Signed)
Plan: Second opinion on leg pain from sports medicine - Dr. Georgina Snell or Dr. Darene Lamer - can schedule this for same day as your annual wellness physical.

## 2016-07-14 LAB — COMPLETE METABOLIC PANEL WITH GFR
AG Ratio: 1.5 Ratio (ref 1.0–2.5)
ALT: 17 U/L (ref 9–46)
AST: 15 U/L (ref 10–35)
Albumin: 3.9 g/dL (ref 3.6–5.1)
Alkaline Phosphatase: 54 U/L (ref 40–115)
BUN/Creatinine Ratio: 8.4 Ratio (ref 6–22)
BUN: 10 mg/dL (ref 7–25)
CHLORIDE: 106 mmol/L (ref 98–110)
CO2: 21 mmol/L (ref 20–31)
Calcium: 9.1 mg/dL (ref 8.6–10.3)
Creat: 1.19 mg/dL (ref 0.70–1.33)
GFR, EST NON AFRICAN AMERICAN: 69 mL/min (ref >=60)
GFR, Est African American: 80 mL/min (ref >=60)
GLUCOSE: 92 mg/dL (ref 65–99)
Globulin: 2.6 g/dL (ref 1.9–3.7)
POTASSIUM: 4.5 mmol/L (ref 3.5–5.3)
SODIUM: 142 mmol/L (ref 135–146)
TOTAL PROTEIN: 6.5 g/dL (ref 6.1–8.1)
Total Bilirubin: 0.6 mg/dL (ref 0.2–1.2)

## 2016-07-14 LAB — CBC WITH DIFFERENTIAL/PLATELET
BASOS PCT: 0 %
Basophils Absolute: 0 cells/uL (ref 0–200)
EOS PCT: 2 %
Eosinophils Absolute: 94 cells/uL (ref 15–500)
HCT: 46.5 % (ref 38.5–50.0)
HEMOGLOBIN: 16.1 g/dL (ref 13.2–17.1)
LYMPHS ABS: 1504 {cells}/uL (ref 850–3900)
Lymphocytes Relative: 32 %
MCH: 29 pg (ref 27.0–33.0)
MCHC: 34.6 g/dL (ref 32.0–36.0)
MCV: 83.8 fL (ref 80.0–100.0)
MONOS PCT: 10 %
MPV: 9.8 fL (ref 7.5–12.5)
Monocytes Absolute: 470 cells/uL (ref 200–950)
Neutro Abs: 2632 cells/uL (ref 1500–7800)
Neutrophils Relative %: 56 %
Platelets: 199 10*3/uL (ref 140–400)
RBC: 5.55 MIL/uL (ref 4.20–5.80)
RDW: 13.8 % (ref 11.0–15.0)
WBC: 4.7 10*3/uL (ref 3.8–10.8)

## 2016-07-14 LAB — LIPID PANEL
CHOL/HDL RATIO: 4.8 ratio (ref <5.0)
CHOLESTEROL: 210 mg/dL — AB (ref <200)
HDL: 44 mg/dL (ref >40)
LDL Cholesterol: 145 mg/dL — ABNORMAL HIGH (ref <100)
Triglycerides: 106 mg/dL (ref <150)
VLDL: 21 mg/dL (ref <30)

## 2016-07-15 LAB — HEPATITIS C ANTIBODY: HCV Ab: NEGATIVE

## 2016-07-15 LAB — HIV ANTIBODY (ROUTINE TESTING W REFLEX): HIV: NONREACTIVE

## 2016-07-15 LAB — VITAMIN D 25 HYDROXY (VIT D DEFICIENCY, FRACTURES): VIT D 25 HYDROXY: 39 ng/mL (ref 30–100)

## 2016-07-15 LAB — TSH: TSH: 1.05 mIU/L (ref 0.40–4.50)

## 2016-07-20 ENCOUNTER — Ambulatory Visit (INDEPENDENT_AMBULATORY_CARE_PROVIDER_SITE_OTHER): Payer: BC Managed Care – PPO | Admitting: Osteopathic Medicine

## 2016-07-20 ENCOUNTER — Encounter: Payer: Self-pay | Admitting: Osteopathic Medicine

## 2016-07-20 ENCOUNTER — Ambulatory Visit (INDEPENDENT_AMBULATORY_CARE_PROVIDER_SITE_OTHER): Payer: BC Managed Care – PPO | Admitting: Family Medicine

## 2016-07-20 ENCOUNTER — Encounter: Payer: Self-pay | Admitting: Family Medicine

## 2016-07-20 ENCOUNTER — Ambulatory Visit (INDEPENDENT_AMBULATORY_CARE_PROVIDER_SITE_OTHER): Payer: BC Managed Care – PPO

## 2016-07-20 ENCOUNTER — Encounter: Payer: Self-pay | Admitting: Internal Medicine

## 2016-07-20 VITALS — BP 111/72 | HR 81 | Ht 67.0 in | Wt 176.0 lb

## 2016-07-20 DIAGNOSIS — M1711 Unilateral primary osteoarthritis, right knee: Secondary | ICD-10-CM | POA: Diagnosis not present

## 2016-07-20 DIAGNOSIS — Z7189 Other specified counseling: Secondary | ICD-10-CM

## 2016-07-20 DIAGNOSIS — Z1211 Encounter for screening for malignant neoplasm of colon: Secondary | ICD-10-CM | POA: Diagnosis not present

## 2016-07-20 DIAGNOSIS — M25461 Effusion, right knee: Secondary | ICD-10-CM

## 2016-07-20 DIAGNOSIS — S76311A Strain of muscle, fascia and tendon of the posterior muscle group at thigh level, right thigh, initial encounter: Secondary | ICD-10-CM

## 2016-07-20 DIAGNOSIS — G8929 Other chronic pain: Secondary | ICD-10-CM

## 2016-07-20 DIAGNOSIS — M25569 Pain in unspecified knee: Secondary | ICD-10-CM | POA: Insufficient documentation

## 2016-07-20 DIAGNOSIS — R202 Paresthesia of skin: Secondary | ICD-10-CM | POA: Insufficient documentation

## 2016-07-20 DIAGNOSIS — M25561 Pain in right knee: Secondary | ICD-10-CM | POA: Diagnosis not present

## 2016-07-20 DIAGNOSIS — S76319A Strain of muscle, fascia and tendon of the posterior muscle group at thigh level, unspecified thigh, initial encounter: Secondary | ICD-10-CM | POA: Insufficient documentation

## 2016-07-20 DIAGNOSIS — Z0000 Encounter for general adult medical examination without abnormal findings: Secondary | ICD-10-CM | POA: Diagnosis not present

## 2016-07-20 MED ORDER — DICLOFENAC SODIUM 1 % TD GEL: 4.0000 g | Freq: Four times a day (QID) | TRANSDERMAL | 11 refills | Status: DC

## 2016-07-20 NOTE — Progress Notes (Signed)
HPI: Robert Benitez is a 56 y.o. male  who presents to [place] today, 07/20/16,  for chief complaint of:  Chief Complaint  Patient presents with  . Annual Exam    Preventive care reviewed as below    Past medical, surgical, social and family history reviewed: Patient Active Problem List   Diagnosis Date Noted  . Other osteoarthritis of spine, lumbar region 05/15/2014  . Dysesthesia of multiple sites 05/15/2014  . Lesion of sciatic nerve 08/06/2013  . Other lesion of femoral nerve 08/06/2013  . Lower extremity numbness 08/06/2013   Past Surgical History:  Procedure Laterality Date  . None listed     Social History  Substance Use Topics  . Smoking status: Never Smoker  . Smokeless tobacco: Never Used  . Alcohol use No   Family History  Problem Relation Age of Onset  . Diabetes Father   . Asthma    . Diabetes       Current medication list and allergy/intolerance information reviewed:   Current Outpatient Prescriptions  Medication Sig Dispense Refill  . meloxicam (MOBIC) 7.5 MG tablet Take 1-2 tablets (7.5-15 mg total) by mouth daily. 30 tablet 1  . Multiple Vitamin (THERA) TABS Take 1 tablet by mouth.     No current facility-administered medications for this visit.    No Known Allergies    Review of Systems:  Constitutional:  No  fever, no chills, No recent illness, No unintentional weight changes. No significant fatigue.   HEENT: No  headache, no vision change  Cardiac: No  chest pain, No  pressure, No palpitations, No  Orthopnea  Respiratory:  No  shortness of breath. No  Cough  Gastrointestinal: No  abdominal pain, No  nausea, No  vomiting,  No  blood in stool  Musculoskeletal: No new myalgia/arthralgia  Skin: No  Rash  Neurologic: No  weakness, No  dizziness  Psychiatric: No  concerns with depression, No  concerns with anxiety  Exam:  BP 111/72   Pulse 81   Ht _0  (1.702 m)   Wt 176 lb (79.8 kg)   BMI  27.57 kg/m   Constitutional: VS see above. General Appearance: alert, well-developed, well-nourished, NAD  Eyes: Normal lids and conjunctive, non-icteric sclera  Ears, Nose, Mouth, Throat: MMM, Normal external inspection ears/nares/mouth/lips/gums. TM normal bilaterally. Pharynx/tonsils no erythema, no exudate. Nasal mucosa normal.   Neck: No masses, trachea midline. No thyroid enlargement. No tenderness/mass appreciated. No lymphadenopathy  Respiratory: Normal respiratory effort. no wheeze, no rhonchi, no rales  Cardiovascular: S1/S2 normal, no murmur, no rub/gallop auscultated. RRR. No lower extremity edema.   Gastrointestinal: Nontender, no masses. No hepatomegaly, no splenomegaly. No hernia appreciated. Bowel sounds normal. Rectal exam deferred.   Musculoskeletal: Gait normal. No clubbing/cyanosis of digits.   Neurological: Normal balance/coordination. No tremor. No cranial nerve deficit on limited exam. Motor and sensation intact and symmetric. Cerebellar reflexes intact.   Skin: warm, dry, intact. No rash/ulcer. No concerning nevi or subq nodules on limited exam.    Psychiatric: Normal judgment/insight. Normal mood and affect. Oriented x3.   Recent Results (from the past 2160 hour(s))  CBC with Differential/Platelet     Status: None   Collection Time: 07/12/16  8:18 AM  Result Value Ref Range   WBC 4.7 3.8 - 10.8 K/uL   RBC 5.55 4.20 - 5.80 MIL/uL   Hemoglobin 16.1 13.2 - 17.1 g/dL   HCT 46.5 38.5 - 50.0 %   MCV 83.8 80.0 -  100.0 fL   MCH 29.0 27.0 - 33.0 pg   MCHC 34.6 32.0 - 36.0 g/dL   RDW 13.8 11.0 - 15.0 %   Platelets 199 140 - 400 K/uL   MPV 9.8 7.5 - 12.5 fL   Neutro Abs 2,632 1,500 - 7,800 cells/uL   Lymphs Abs 1,504 850 - 3,900 cells/uL   Monocytes Absolute 470 200 - 950 cells/uL   Eosinophils Absolute 94 15 - 500 cells/uL   Basophils Absolute 0 0 - 200 cells/uL   Neutrophils Relative % 56 %   Lymphocytes Relative 32 %   Monocytes Relative 10 %    Eosinophils Relative 2 %   Basophils Relative 0 %   Smear Review Criteria for review not met   COMPLETE METABOLIC PANEL WITH GFR     Status: None   Collection Time: 07/12/16  8:18 AM  Result Value Ref Range   Sodium 142 135 - 146 mmol/L   Potassium 4.5 3.5 - 5.3 mmol/L   Chloride 106 98 - 110 mmol/L   CO2 21 20 - 31 mmol/L   Glucose, Bld 92 65 - 99 mg/dL   BUN 10 7 - 25 mg/dL   Creat 1.19 0.70 - 1.33 mg/dL    Comment:   For patients > or = 54 years of age: The upper reference limit for Creatinine is approximately 13% higher for people identified as African-American.      Total Bilirubin 0.6 0.2 - 1.2 mg/dL   Alkaline Phosphatase 54 40 - 115 U/L   AST 15 10 - 35 U/L   ALT 17 9 - 46 U/L   Total Protein 6.5 6.1 - 8.1 g/dL   Albumin 3.9 3.6 - 5.1 g/dL   Calcium 9.1 8.6 - 10.3 mg/dL   Globulin 2.6 1.9 - 3.7 g/dL   AG Ratio 1.5 1.0 - 2.5 Ratio   BUN/Creatinine Ratio 8.4 6 - 22 Ratio   GFR, Est African American 80 >=60 mL/min   GFR, Est Non African American 69 >=60 mL/min  Lipid panel     Status: Abnormal   Collection Time: 07/12/16  8:18 AM  Result Value Ref Range   Cholesterol 210 (H) <200 mg/dL   Triglycerides 106 <150 mg/dL   HDL 44 >40 mg/dL   Total CHOL/HDL Ratio 4.8 <5.0 Ratio   VLDL 21 <30 mg/dL   LDL Cholesterol 145 (H) <100 mg/dL  TSH     Status: None   Collection Time: 07/12/16  8:18 AM  Result Value Ref Range   TSH 1.05 0.40 - 4.50 mIU/L  VITAMIN D 25 Hydroxy (Vit-D Deficiency, Fractures)     Status: None   Collection Time: 07/12/16  8:18 AM  Result Value Ref Range   Vit D, 25-Hydroxy 39 30 - 100 ng/mL    Comment: Vitamin D Status           25-OH Vitamin D        Deficiency                <20 ng/mL        Insufficiency         20 - 29 ng/mL        Optimal             > or = 30 ng/mL   For 25-OH Vitamin D testing on patients on D2-supplementation and patients for whom quantitation of D2 and D3 fractions is required, the Quest[employer]ure[employer] 25-OH VIT D, (D2,D3),  LC/MS/MS is recommended:  order code [id number] (patients > 2 yrs).   Hepatitis C antibody     Status: None   Collection Time: 07/12/16  8:18 AM  Result Value Ref Range   HCV Ab NEGATIVE NEGATIVE  HIV antibody     Status: None   Collection Time: 07/12/16  8:18 AM  Result Value Ref Range   HIV 1&2 Ab, 4th Generation NONREACTIVE NONREACTIVE    Comment:   HIV-1 antigen and HIV-1/HIV-2 antibodies were not detected.  There is no laboratory evidence of HIV infection.   HIV-1/2 Antibody Diff        Not indicated. HIV-1 RNA, Qual TMA          Not indicated.     PLEASE NOTE: This information has been disclosed to you from records whose confidentiality may be protected by state law. If your state requires such protection, then the state law prohibits you from making any further disclosure of the information without the specific written consent of the person to whom it pertains, or as otherwise permitted by law. A general authorization for the release of medical or other information is NOT sufficient for this purpose.   The performance of this assay has not been clinically validated in patients less than 66 years old.   For additional information please refer to http://education.questdiagnostics.com/faq/FAQ106.  (This link is being provided for informational/educational purposes only.)      Lab results reviewed in detail with the patientc  ASSESSMENT/PLAN:   Annual physical exam - Plan: Ambulatory referral to Gastroenterology  Colon cancer screening - Plan: Ambulatory referral to Gastroenterology  Cardiac risk counseling - ASCVD risk 5.2% 54 years   MALE PREVENTIVE CARE  updated 07/20/16  ANNUAL SCREENING/COUNSELING  Any changes to health in the past year? no  Diet/Exercise - HEALTHY HABITS DISCUSSED TO DECREASE CV RISK History  Smoking Status  . Never Smoker  Smokeless Tobacco  . Never Used   History  Alcohol Use No   No flowsheet data found.  [place]  Sexually active in the past year? - Yes with male.  STI testing needed/desired today? - no  Any concerns with testosterone/libido? - no  INFECTIOUS DISEASE SCREENING  HIV - does not need  GC/CT - does not need  HepC - does not need  TB - does not need  CANCER SCREENING  Lung - does not need  Colon - needs  Prostate - does not need  OTHER DISEASE SCREENING  Lipid - does not need  ASCVD risk: 5.2% x10 years  DM2 - does not need  AAA - 65-75yo ever smoked: does not need  Osteoporosis - men 54yo+ - does not need  Fracture after age 37? no  Chronic steroid use? no  Smoking/Alcohol? no  Low body weight <127lb? no  Hip fracture in parent? no  Malabsorbtion, CLD, IBD, RA? no  ADULT VACCINATION  Influenza - annual vaccine recommended  Td - booster every 10 years - will get records   Zoster - option at 32, yes at 60+   PCV13 - was not indicated  PPSV23 - was not indicated  There is no immunization history on file for this patient.     Visit summary with medication list and pertinent instructions was printed for patient to review. All questions at time of visit were answered - patient instructed to contact office with any additional concerns. ER/RTC precautions were reviewed with the patient. Follow-up plan: Return in about 1 year (around 07/20/2017) for [facility] PHYSICAL, SOONER IF  NEEDED.

## 2016-07-20 NOTE — Patient Instructions (Signed)
Thank you for coming in today. We will get records of the MRI.  Recheck in 1 month.  Use the voltaren gel on the knee.  Attend PT.  Recheck sooner if needed.

## 2016-07-20 NOTE — Progress Notes (Signed)
Subjective:    I'm seeing this patient as a consultation for:  Robert Benitez  CC: right leg pain  HPI: Robert Benitez is a 56 yo man who presents with right leg pain.    Right Leg: He has had right leg pain for the last 30 years that begins at the posterior thigh/groin and radiated medially and distally to the lower leg.  This changes location periodically and has burning/tingling.  He reports the posterior thigh feels tight constantly.   He is also having pain at the right medial lower right leg that started about a year ago.  It is a dull, achy pain that has moved from the distal medial lower leg slightly more proximally.    Right Knee: He is currently most afflicted by knee pain starting around 5 years ago.  It is on the anterior aspect, proximal before the patella.  This feels pressured and has progressively worsened.    He had two EMGs and an MRI in the past.  Massage, gabapentin have helped in the past.  He has had similar pain as a child [place].  He felt paraesthesias in his cervical neck and shoulders.  This no longer bothers him.  He is not sure about any childhood illness or fevers.  Malaria was common in the area.  He got the BCG vaccine and the CXRs for TB have always been negative.     Past medical history, Surgical history, Family history not pertinant except as noted below, Social history, Allergies, and medications have been entered into the medical record, reviewed, and no changes needed.   Review of Systems: No headache, visual changes, nausea, vomiting, diarrhea, constipation, dizziness, abdominal pain, skin rash, fevers, chills, night sweats, weight loss, swollen lymph nodes, body aches, joint swelling, muscle aches, chest pain, shortness of breath, mood changes, visual or auditory hallucinations.   Objective:    Vitals:   07/20/16 0935  BP: 111/72  Pulse: 81   General: Well Developed, well nourished, and in no acute distress.  Neuro/Psych:  Alert and oriented x3, extra-ocular muscles intact, able to move all 4 extremities, sensation grossly intact. Skin: Warm and dry, no rashes noted.  Respiratory: Not using accessory muscles, speaking in full sentences, trachea midline.  Cardiovascular: Pulses palpable, no extremity edema. Abdomen: Does not appear distended. MSK:  Normal gait Right knee:  No obvious deformities, mild effusion mildly lateral/distal to patella.  Crepitation.  No tenderness to palpitation.  Full ROM, strength 5/5.   Right lower leg/Foot:  Scarring noted on anterior lower leg.  No tenderness at medial malleolus.   No pain with resisted planar/dorsiflexion nor inversion/eversion.   No results found for this or any previous visit (from the past 24 hour(s)). Dg Knee 1-2 Views Left  Result Date: 07/20/2016 CLINICAL DATA:  Patient c/o right anterior knee pains just above the patella, denies any specific injury, left knee for comparison, no other complaints EXAM: LEFT KNEE - 1-2 VIEW COMPARISON:  None. FINDINGS: The acquired AP only views of the left knee show no fracture or bone lesion. The femorotibial joint space compartments are well preserved. There is no convincing joint effusion and the soft tissues are unremarkable. IMPRESSION: Normal AP appearance of the left knee. Electronically Signed   By: Lajean Manes M.D.   On: 07/20/2016 10:47   Dg Knee Complete 4 Views Right  Result Date: 07/20/2016 CLINICAL DATA:  Patient c/o right anterior knee pains just above the patella, denies any specific  injury, left knee for comparison, no other complaints EXAM: RIGHT KNEE - COMPLETE 4+ VIEW COMPARISON:  None. FINDINGS: No fracture or bone lesion. The patellofemoral joint space compartments are well maintained with no arthropathic change. There is mild arthropathic change at the patellofemoral joint space with some mild dorsal patellar subchondral irregularity and lateral patellar marginal osteophyte formation. A minimal joint effusion  is noted. Soft tissues are unremarkable. IMPRESSION: 1. No fracture, bone lesion or acute finding. 2. Mild patellofemoral joint space compartment arthropathic changes associated with a minimal joint effusion. Electronically Signed   By: Lajean Manes M.D.   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 Mild spondylitic changes at L5-S1. EMG/[place] on 03/07/12 without evidence of large fiber peripheral neuropathy or right lumbosacral radiculopathy. He never had labs done for further testing. He did not find gabapentin to be beneficial however he never titrated the medication beyond 100 mg." A/P recommends walking for exercise and f/u 6 mos. Visit 05/2014 - gabapentin recommended, mentioned other emds such as Lyrica or topical treatment  Repeat EMG 05/2014: "1. No electrophysiologic findings to explain his right anterior thigh sensations or his right medial lower leg paresthesias. No evidence for acute/ongoing radiculopathy, plexopathy or polyneuropathy.  2. 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 However, this does not explain his clinical symptoms and so appears to be more of an incidental finding. The relative decrease in right peroneal motor amplitude potential as compared to the left side could be consistent with his remote history of sciatica.  3. Clinical correlation recommended."    Impression and Recommendations:    Assessment and Plan: 56 y.o. male with patellofemoral myalgia, hamstring strain and paraesthesias in the right leg.   Right Knee:  Xray of right knee has some mild arthritic changes.  I feel the patient would benefit from PT to strength the quad and help relieve tension on the hamstrings.  Continue meloxicam for pain and diclofenac gel.    Paraesthesias: Would like to see past studies done.  Waiting for  [facility] to send records.  Will follow up in a month and reevaluate at that point.  Could be structural or rheumatological etiology.       Discussed warning signs or symptoms. Please see discharge instructions. Patient expresses understanding.

## 2016-07-26 ENCOUNTER — Encounter: Payer: Self-pay | Admitting: Rehabilitative and Restorative Service Providers"

## 2016-07-26 ENCOUNTER — Ambulatory Visit (INDEPENDENT_AMBULATORY_CARE_PROVIDER_SITE_OTHER): Payer: BC Managed Care – PPO | Admitting: Rehabilitative and Restorative Service Providers"

## 2016-07-26 DIAGNOSIS — G8929 Other chronic pain: Secondary | ICD-10-CM | POA: Diagnosis not present

## 2016-07-26 DIAGNOSIS — R29898 Other symptoms and signs involving the musculoskeletal system: Secondary | ICD-10-CM

## 2016-07-26 DIAGNOSIS — M25561 Pain in right knee: Secondary | ICD-10-CM

## 2016-07-26 NOTE — Patient Instructions (Addendum)
HIP: Hamstrings - Supine   Place strap around foot. Raise leg up, keeping knee straight.  Bend opposite knee to protect back if indicated. Hold 30 seconds. 3 reps per set, 2-3 sets per day     Outer Hip Stretch: Reclined IT Band Stretch (Strap)   Strap around one foot, pull leg across body until you feel a pull or stretch, with shoulders on mat. Hold for 30 seconds. Repeat 3 times each leg. 2-3 times/day.  Piriformis Stretch   Lying on back, pull right knee toward opposite shoulder. Hold 30 seconds. Repeat 3 times. Do 2-3 sessions per day.   Quads / HF, Supine   Lie near edge of bed, pull both knees up toward chest. Hold one knee as you drop the other leg off the edge of the bed.  Relax hanging knee/can bend knee back if indicated. Hold 30 seconds. Repeat 3 times per session. Do 2-3 sessions per day.    Quads / HF, Prone   Lie face down. Grasp one ankle with same-side hand. Use towel if needed to reach. Gently pull foot toward buttock.  Hold 30 seconds. Repeat 3 times per session. Do 2-3 sessions per day.   Quads / HF, sitting in chair one leg off edge    Kneel in deep lunge, behind leg on floor. Push pelvis down slowly while slightly arching back until stretch is felt on front of hip. Hold _30-60__ seconds. Repeat _3__ times per session. Do _2-4__ sessions per day.   Stretch out strap - [employer] (green strap)

## 2016-07-26 NOTE — Therapy (Signed)
[place] [address] [place], [place], [address] Phone: [phone]   Fax:  [phone]  Physical Therapy Evaluation  Patient Details  Name: Robert Benitez MRN: [id number] Date of Birth: 09-24-1960 Referring Provider: Dr Lynne Leader   Encounter Date: 07/26/2016      PT End of Session - 07/26/16 1519    Visit Number 1   Number of Visits 12   Date for PT Re-Evaluation 09/06/16   PT Start Time 4580   PT Stop Time 1612   PT Time Calculation (min) 57 min      Past Medical History:  Diagnosis Date  . Anxiety   . Depression   . Sciatica     Past Surgical History:  Procedure Laterality Date  . None listed      There were no vitals filed for this visit.       Subjective Assessment - 07/26/16 1519    Subjective Patient reports that he has ben having pain in the Rt knee for "some time". He has pain centered around the Rt knee as well as pain in the medial ankle area on an intermittent basis. Some difficulty with stairs at time. xray of the Rt knee shows some arthritis.    Pertinent History Sciatica Rt LE intermittently for the past 20 years; pain and tightness in the back of the hip; groin; anterior thigh over the past 4-5 years   How long can you sit comfortably? no limit   How long can you stand comfortably? no limit   How long can you walk comfortably? no limit   Diagnostic tests xray Rt knee - arthritis    Patient Stated Goals to get rid of leg pain    Currently in Pain? Yes   Pain Score 6    Pain Location Knee   Pain Orientation Right   Pain Descriptors / Indicators Aching;Dull   Pain Type Chronic pain   Pain Onset More than a month ago   Pain Frequency Intermittent   Aggravating Factors  bending Rt knee    Pain Relieving Factors traightening leg and massaging leg             [facility] PT Assessment - 07/26/16 0001      Assessment   Medical Diagnosis Rt knee pain; hamstring strain    Referring  Provider Dr Lynne Leader    Onset Date/Surgical Date 08/19/14  2 years    Hand Dominance Right   Next MD Visit after PT    Prior Therapy no      Precautions   Precautions None     Balance Screen   Has the patient fallen in the past 6 months No   Has the patient had a decrease in activity level because of a fear of falling?  No   Is the patient reluctant to leave their home because of a fear of falling?  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    Leisure 20-30 min 4 times/week; yard work; household chores      Observation/Other Assessments   Focus on Therapeutic Outcomes (FOTO)  25% limitation     Sensation   Additional Comments intermittent tingling medial leg just proximal  to ankle      Posture/Postural Control   Posture Comments slight increase in lumbar lordosis; incresaed thoracic kyphosis     AROM   Right/Left Hip --  tight hip extension   Right/Left Knee --  WFL's    Right/Left Ankle --  WFL's    Lumbar Flexion 90%   Lumbar Extension 70%   Lumbar - Right Side Bend 80%   Lumbar - Left Side Bend 80%   Lumbar - Right Rotation 40%   Lumbar - Left Rotation 50%     Strength   Overall Strength Comments 5/5 bilat LE's      Flexibility   Hamstrings tight Rt/Lt ~ 80 deg    Quadriceps tight Rt > Lt good motion    ITB tight Rt > Lt    Piriformis tight Rt > Lt      Palpation   Palpation comment WFL's lumbar      Balance   Balance Assessed --  SLS~10 sec no UE support each LE                    [facility] Adult PT Treatment/Exercise - 07/26/16 0001      Knee/Hip Exercises: Stretches   Passive Hamstring Stretch 2 reps;30 seconds  supine with strap    Quad Stretch 30 seconds;5 reps  prone with strap - added foam roll  distal thigh x 2 at 30sec   ITB Stretch 2 reps;30 seconds   Piriformis Stretch 2 reps;30 seconds   Other Knee/Hip Stretches hip flexor stretch sitting 30 sec x 2      Moist Heat Therapy   Number Minutes Moist Heat 15 Minutes   Moist Heat Location Knee  Rt                 PT Education - 07/26/16 1550    Education provided Yes   Education Details HEP    Person(s) Educated Patient   Methods Explanation;Demonstration;Tactile cues;Verbal cues;Handout   Comprehension Verbalized understanding;Returned demonstration;Verbal cues required;Tactile cues required             PT Long Term Goals - 07/26/16 1642      PT LONG TERM GOAL #1   Title Increase tissue extensibility Rt LE thus increasing movement quality with less restrictions of tight musculature 09/06/16   Time 6   Period Weeks   Status New     PT LONG TERM GOAL #2   Title Decrease pain in Rt knee area by 50-75% allowing patient to perform ADL's with less pain 09/06/16   Time 6   Period Weeks   Status New     PT LONG TERM GOAL #3   Title Independent in HEP 09/06/16   Time 6   Period Weeks   Status New     PT LONG TERM GOAL #4   Title Improve FOTO to </= 24% limitation 09/06/16   Time 6   Period Weeks   Status New               Plan - 07/26/16 1628    Clinical Impression Statement Robert Benitez is seen fo r a low complexity evaluation of chronic pain in Rt knee with c/o some intermittent pain and tinging in the Rt hip/medial ankle area. He reports the the pain is increased with functional activities. He has muscular tightness through the Rt hip flexors and quads on Rt. Patient will benefit from PT to address the problems identified.    Rehab Potential Good  PT Frequency 1x / week   PT Duration 6 weeks   PT Treatment/Interventions Patient/family education;ADLs/Self Care Home Management;Cryotherapy;Electrical Stimulation;Iontophoresis 4mg /ml Dexamethasone;Moist Heat;Ultrasound;Dry needling;Manual  techniques;Therapeutic activities;Therapeutic exercise   PT Next Visit Plan assess response to stretching; further evaluation of symptoms for additional interventions   Consulted and Agree with Plan of Care Patient      Patient will benefit from skilled therapeutic intervention in order to improve the following deficits and impairments:  Pain, Increased fascial restricitons, Decreased range of motion, Decreased mobility, Decreased activity tolerance  Visit Diagnosis: Chronic pain of right knee - Plan: PT plan of care cert/re-cert  Other symptoms and signs involving the musculoskeletal system - Plan: PT plan of care cert/re-cert     Problem List Patient Active Problem List   Diagnosis Date Noted  . Annual physical exam 07/20/2016  . Cardiac risk counseling 07/20/2016  . Knee pain 07/20/2016  . Paresthesia 07/20/2016  . Hamstring strain 07/20/2016  . 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 Other osteoarthritis of spine, lumbar region 05/15/2014  . Dysesthesia of multiple sites 05/15/2014  . Lesion of sciatic nerve 08/06/2013  . Other lesion of femoral nerve 08/06/2013  . Lower extremity numbness 08/06/2013    Celyn Nilda Simmer PT, MPH  07/26/2016, 4:51 PM  [facility] [address] [place], [place], [address] Phone: [phone]   Fax:  [phone]  Name: Robert Benitez MRN: [id number] Date of Birth: [birth year]

## 2016-08-02 ENCOUNTER — Ambulatory Visit (INDEPENDENT_AMBULATORY_CARE_PROVIDER_SITE_OTHER): Payer: BC Managed Care – PPO | Admitting: Rehabilitative and Restorative Service Providers"

## 2016-08-02 ENCOUNTER — Encounter: Payer: Self-pay | Admitting: Rehabilitative and Restorative Service Providers"

## 2016-08-02 DIAGNOSIS — G8929 Other chronic pain: Secondary | ICD-10-CM

## 2016-08-02 DIAGNOSIS — R29898 Other symptoms and signs involving the musculoskeletal system: Secondary | ICD-10-CM | POA: Diagnosis not present

## 2016-08-02 DIAGNOSIS — M25561 Pain in right knee: Secondary | ICD-10-CM

## 2016-08-02 NOTE — Therapy (Addendum)
[facility] [place], [place], [address] Phone: [phone]   Fax:  [phone]  Physical Therapy Treatment  Patient Details  Name: Robert Benitez MRN: [id number] Date of Birth: 1960/09/07 Referring Provider: Dr Lynne Leader  Encounter Date: 08/02/2016      PT End of Session - 08/02/16 1516    Visit Number 2   Number of Visits 12   Date for PT Re-Evaluation 09/06/16   PT Start Time 1601   PT Stop Time 1606   PT Time Calculation (min) 49 min   Activity Tolerance Patient tolerated treatment well      Past Medical History:  Diagnosis Date  . Anxiety   . Depression   . Sciatica     Past Surgical History:  Procedure Laterality Date  . None listed      There were no vitals filed for this visit.      Subjective Assessment - 08/02/16 1518    Subjective Patient reports that his pain and tightness is a little bit better. He has done the exercises but maybe not as much as he would like to. He didn't stretch at all one day - too busy.    Currently in Pain? Yes   Pain Score 4    Pain Location Knee   Pain Orientation Right   Pain Descriptors / Indicators Aching;Dull   Pain Type Chronic pain   Pain Onset More than a month ago   Pain Frequency Intermittent            [facility] PT Assessment - 08/02/16 0001      Assessment   Medical Diagnosis Rt knee pain; hamstring strain    Referring Provider Dr Lynne Leader   Onset Date/Surgical Date 08/19/14  2 years    Hand Dominance Right   Next MD Visit after PT    Prior Therapy no      AROM   Lumbar Flexion 90%   Lumbar Extension 70%     Flexibility   Hamstrings tight Rt~ 90 deg    Quadriceps tight Rt > Lt    ITB tight Rt > Lt    Piriformis tight Rt > Lt                      [facility] Adult PT Treatment/Exercise - 08/02/16 0001      Knee/Hip Exercises: Stretches   Passive Hamstring Stretch 2 reps;60 seconds  supine with strap    Quad  Stretch 5 reps;60 seconds  prone with strap - added foam roll distal thigh x 2 at 30sec   ITB Stretch 2 reps;30 seconds   Piriformis Stretch 2 reps;30 seconds   Other Knee/Hip Stretches hip flexor stretch sitting 60 sec x 2    Other Knee/Hip Stretches hip adductor stretch - butterfly - 45 sec x 2                 PT Education - 08/02/16 1551    Education provided Yes   Education Details HEP    Person(s) Educated Patient   Methods Explanation;Demonstration;Tactile cues;Verbal cues;Handout   Comprehension Verbalized understanding;Returned demonstration;Verbal cues required;Tactile cues required             PT Long Term Goals - 08/02/16 1525      PT LONG TERM GOAL #1   Title Increase tissue extensibility Rt LE thus increasing movement quality with less restrictions of tight musculature 09/06/16   Time 6   Period  Weeks   Status Achieved     PT LONG TERM GOAL #2   Title Decrease pain in Rt knee area by 50-75% allowing patient to perform ADL's with less pain 09/06/16   Time 6   Period Weeks   Status Partially Met     PT LONG TERM GOAL #3   Title Independent in HEP 09/06/16   Time 6   Period Weeks   Status Achieved     PT LONG TERM GOAL #4   Title Improve FOTO to </= 24% limitation 09/06/16   Time 6   Period Weeks   Status On-going               Plan - 08/02/16 1526    Clinical Impression Statement Patient reports improvement in symptoms with exercises. He continues to have muscular tightness with stretching and with palpation. Good improveement with current plan with the addition of new exercises today. Patient would like to continue with independent HEP and will call if he feels he needs further PT visits.    PT Frequency 1x / week   PT Duration 6 weeks   PT Treatment/Interventions Patient/family education;ADLs/Self Care Home Management;Cryotherapy;Electrical Stimulation;Iontophoresis 87m/ml Dexamethasone;Moist Heat;Ultrasound;Dry needling;Manual  techniques;Therapeutic activities;Therapeutic exercise   PT Next Visit Plan continue with stretching of LE's and trunk; further evaluation of symptoms for additional interventions as indicated Patient will be on hold for 2 weeks to continue with independent HEP - will call if he feels he needs to schedule additional appointments   Consulted and Agree with Plan of Care Patient      Patient will benefit from skilled therapeutic intervention in order to improve the following deficits and impairments:  Pain, Increased fascial restricitons, Decreased range of motion, Decreased mobility, Decreased activity tolerance  Visit Diagnosis: Chronic pain of right knee  Other symptoms and signs involving the musculoskeletal system     Problem List Patient Active Problem List   Diagnosis Date Noted  . Annual physical exam 07/20/2016  . Cardiac risk counseling 07/20/2016  . Knee pain 07/20/2016  . Paresthesia 07/20/2016  . Hamstring strain 07/20/2016  . 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 Other osteoarthritis of spine, lumbar region 05/15/2014  . Dysesthesia of multiple sites 05/15/2014  . Lesion of sciatic nerve 08/06/2013  . Other lesion of femoral nerve 08/06/2013  . Lower extremity numbness 08/06/2013    Celyn PNilda SimmerPT, MPH  08/02/2016, 4:37 PM  [facility] [address] [address]Phone: [phone]  Fax:  [phone] Name: SMasaji BillupsMRN: [id number] of Birth: [birth year] PHYSICAL THERAPY DISCHARGE SUMMARY  Visits from Start of Care: 2  Current functional level related to goals / functional outcomes: See progress note for discharge status   Remaining deficits: Needs to continue consistent HEP focus on stretching    Education / Equipment: HEP  Plan: Patient agrees to discharge.  Patient goals were partially met. Patient is being discharged due to being pleased with the current functional level.   ?????    Celyn P. HHelene KelpPT, MPH 08/25/16 2:43 PM

## 2016-08-02 NOTE — Patient Instructions (Addendum)
Stretching: Inner Thigh LYING ON BACK    Place heels together and pull feet toward groin until stretch is felt in groin and inner thigh. Hold _45-60___ seconds. Repeat __3__ times per set. Do __2__ sessions per day.   Stretching: Gastroc    Stand with right foot back, leg straight, forward leg bent. Keeping heel on floor, turned slightly out, lean into wall until stretch is felt in calf. Hold _30-45___ seconds. Repeat __3__ times per set. Do __2__ sessions per day.   Stretching: Soleus    Stand with right foot back, both knees bent. Keeping heel on floor, turned slightly out, lean into wall until stretch is felt in lower calf. Hold __30-45__ seconds. Repeat __3__ times per set. Do _2___ sessions per day.    Stretching: Hamstring (SITTING)    Place right foot on stool. Slowly lean forward, keeping back straight, until stretch is felt in back of thigh. Hold __60__ seconds. Repeat _2-3__ times per set.  Do __2__ sessions per day.   Trunk: Prone Extension (Press-Ups)    Lie on stomach on firm, flat surface. Relax bottom and legs. Raise chest in air with elbows straight. Keep hips flat on surface, sag stomach. Hold _3___ seconds. Repeat _10___ times. Do __2__ sessions per day. CAUTION: Movement should be gentle and slow.

## 2016-08-16 ENCOUNTER — Ambulatory Visit (INDEPENDENT_AMBULATORY_CARE_PROVIDER_SITE_OTHER): Payer: BC Managed Care – PPO | Admitting: Family Medicine

## 2016-08-16 VITALS — BP 121/75 | HR 76 | Temp 97.6°F | Wt 178.0 lb

## 2016-08-16 DIAGNOSIS — M25561 Pain in right knee: Secondary | ICD-10-CM

## 2016-08-16 DIAGNOSIS — M1711 Unilateral primary osteoarthritis, right knee: Secondary | ICD-10-CM

## 2016-08-16 DIAGNOSIS — R202 Paresthesia of skin: Secondary | ICD-10-CM

## 2016-08-16 DIAGNOSIS — G8929 Other chronic pain: Secondary | ICD-10-CM

## 2016-08-16 NOTE — Patient Instructions (Signed)
Thank you for coming in today. Continue home therapy.  Continue to exercise.  Recheck as needed.

## 2016-08-16 NOTE — Progress Notes (Signed)
   Robert Benitez is a 56 y.o. male who presents to [facility] today for follow-up right knee pain and paresthesias. Patient was seen in early May for right knee pain and paresthesias. He had a trial of physical therapy and notes near-complete resolution of knee pain as well as significant improvement and paresthesias. He's feeling great.   Past Medical History:  Diagnosis Date  . Anxiety   . Depression   . Sciatica    Past Surgical History:  Procedure Laterality Date  . None listed     Social History  Substance Use Topics  . Smoking status: Never Smoker  . Smokeless tobacco: Never Used  . Alcohol use No     ROS:  As above   Medications: Current Outpatient Prescriptions  Medication Sig Dispense Refill  . diclofenac sodium (VOLTAREN) 1 % GEL Apply 4 g topically 4 (four) times daily. To affected joint. (Patient not taking: Reported on 08/16/2016) 100 g 11  . meloxicam (MOBIC) 7.5 MG tablet Take 1-2 tablets (7.5-15 mg total) by mouth daily. 30 tablet 1  . Multiple Vitamin (THERA) TABS Take 1 tablet by mouth.     No current facility-administered medications for this visit.    No Known Allergies   Exam:  BP 121/75 (BP Location: Left Arm, Patient Position: Sitting, Cuff Size: Normal)   Pulse 76   Temp 97.6 F (36.4 C) (Oral)   Wt 178 lb (80.7 kg)   SpO2 98%   BMI 27.88 kg/m  General: Well Developed, well nourished, and in no acute distress.  Neuro/Psych: Alert and oriented x3, extra-ocular muscles intact, able to move all 4 extremities, sensation grossly intact. Skin: Warm and dry, no rashes noted.  Respiratory: Not using accessory muscles, speaking in full sentences, trachea midline.  Cardiovascular: Pulses palpable, no extremity edema. Abdomen: Does not appear distended. MSK: Normal gait nontender right knee    No results found for this or any previous visit (from the past 48 hour(s)). No results  found.    Assessment and Plan: 56 y.o. male with resolving right knee pain and paresthesias. Plan to continue home physical therapy type exercises and recheck as needed.    No orders of the defined types were placed in this encounter.  No orders of the defined types were placed in this encounter.   Discussed warning signs or symptoms. Please see discharge instructions. Patient expresses understanding.

## 2016-08-16 NOTE — Progress Notes (Signed)
Pt has a history of right leg pain and knee pain.  Has had therapy and stated that knee pain has improved.

## 2016-08-29 ENCOUNTER — Ambulatory Visit: Payer: BC Managed Care – PPO | Admitting: *Deleted

## 2016-08-29 VITALS — Ht 67.0 in | Wt 175.8 lb

## 2016-08-29 DIAGNOSIS — Z1211 Encounter for screening for malignant neoplasm of colon: Secondary | ICD-10-CM

## 2016-08-29 NOTE — Progress Notes (Signed)
Denies allergies to eggs or soy products. Denies complications with sedation or anesthesia. Denies O2 use. Denies use of diet or weight loss medications.  Emmi instructions given for colonoscopy.  

## 2016-09-04 ENCOUNTER — Encounter: Payer: Self-pay | Admitting: Internal Medicine

## 2016-09-18 ENCOUNTER — Encounter: Payer: Self-pay | Admitting: Internal Medicine

## 2016-09-18 ENCOUNTER — Ambulatory Visit (AMBULATORY_SURGERY_CENTER): Payer: 59 | Admitting: Internal Medicine

## 2016-09-18 VITALS — BP 96/65 | HR 80 | Temp 98.6°F | Resp 12 | Ht 67.0 in | Wt 175.0 lb

## 2016-09-18 DIAGNOSIS — Z1212 Encounter for screening for malignant neoplasm of rectum: Secondary | ICD-10-CM

## 2016-09-18 DIAGNOSIS — D122 Benign neoplasm of ascending colon: Secondary | ICD-10-CM

## 2016-09-18 DIAGNOSIS — Z1211 Encounter for screening for malignant neoplasm of colon: Secondary | ICD-10-CM

## 2016-09-18 HISTORY — PX: COLONOSCOPY: SHX174

## 2016-09-18 MED ORDER — SODIUM CHLORIDE 0.9 % IV SOLN: 500.0000 mL | INTRAVENOUS | Status: DC

## 2016-09-18 NOTE — Op Note (Signed)
[facility] Patient Name: Robert Benitez Procedure Date: 09/18/2016 10:00 AM MRN: [id number] Endoscopist: Gatha Mayer , MD Age: 56 Referring MD:  Date of Birth: [birth year] Gender: Male Account #: [id number] Procedure:                Colonoscopy Indications:              Screening for colorectal malignant neoplasm, This                            is the patient's first colonoscopy Medicines:                Propofol per Anesthesia, Monitored Anesthesia Care Procedure:                Pre-Anesthesia Assessment:                           - Prior to the procedure, a History and Physical                            was performed, and patient medications and                            allergies were reviewed. The patient's tolerance of                            previous anesthesia was also reviewed. The risks                            and benefits of the procedure and the sedation                            options and risks were discussed with the patient.                            All questions were answered, and informed consent                            was obtained. Prior Anticoagulants: The patient has                            taken no previous anticoagulant or antiplatelet                            agents. ASA Grade Assessment: I - A normal, healthy                            patient. After reviewing the risks and benefits,                            the patient was deemed in satisfactory condition to                            undergo the procedure.  After obtaining informed consent, the colonoscope                            was passed under direct vision. Throughout the                            procedure, the patient's blood pressure, pulse, and                            oxygen saturations were monitored continuously. The                            Model CF-HQ190L [id number]) scope was introduced                            through the  anus and advanced to the the cecum,                            identified by appendiceal orifice and ileocecal                            valve. The patient tolerated the procedure well.                            The quality of the bowel preparation was excellent.                            The bowel preparation used was Miralax. The                            ileocecal valve, appendiceal orifice, and rectum                            were photographed. The colonoscopy was somewhat                            difficult due to significant looping. Successful                            completion of the procedure was aided by using                            manual pressure in LUQ. Scope In: 10:15:29 AM Scope Out: 10:35:47 AM Scope Withdrawal Time: 0 hours 13 minutes 48 seconds  Total Procedure Duration: 0 hours 20 minutes 18 seconds  Findings:                 The perianal and digital rectal examinations were                            normal. Pertinent negatives include normal prostate                            (size, shape, and consistency).  A 10 mm polyp was found in the ascending colon. The                            polyp was multi-lobulated and sessile. The polyp                            was removed with a cold snare. Resection and                            retrieval were complete. Verification of patient                            identification for the specimen was done. Estimated                            blood loss was minimal.                           The exam was otherwise without abnormality on                            direct and retroflexion views. Complications:            No immediate complications. Estimated Blood Loss:     Estimated blood loss was minimal. Impression:               - One 10 mm polyp in the ascending colon, removed                            with a cold snare. Resected and retrieved.                           - The  examination was otherwise normal on direct                            and retroflexion views. Recommendation:           - Patient has a contact number available for                            emergencies. The signs and symptoms of potential                            delayed complications were discussed with the                            patient. Return to normal activities tomorrow.                            Written discharge instructions were provided to the                            patient.                           -  Resume previous diet.                           - Continue present medications.                           - Repeat colonoscopy for surveillance based on                            pathology results. Gatha Mayer, MD 09/18/2016 10:43:09 AM This report has been signed electronically.

## 2016-09-18 NOTE — Progress Notes (Signed)
A/ox3 pleased with MAC, report to karen rn

## 2016-09-18 NOTE — Progress Notes (Signed)
Called to room to assist during endoscopic procedure.  Patient ID and intended procedure confirmed with present staff. Received instructions for my participation in the procedure from the performing physician.  

## 2016-09-18 NOTE — Patient Instructions (Addendum)
    I found and removed one polyp. I will let you know pathology results and when to have another routine colonoscopy by mail and/or My Chart.  All else normal.  I appreciate the opportunity to care for you. Gatha Mayer, MD, [facility]  HANDOUT GIVEN: POLYPS  YOU HAD AN ENDOSCOPIC PROCEDURE TODAY AT [facility]:   Refer to the procedure report that was given to you for any specific questions about what was found during the examination.  If the procedure report does not answer your questions, please call your gastroenterologist to clarify.  If you requested that your care partner not be given the details of your procedure findings, then the procedure report has been included in a sealed envelope for you to review at your convenience later.  YOU SHOULD EXPECT: Some feelings of bloating in the abdomen. Passage of more gas than usual.  Walking can help get rid of the air that was put into your GI tract during the procedure and reduce the bloating. If you had a lower endoscopy (such as a colonoscopy or flexible sigmoidoscopy) you may notice spotting of blood in your stool or on the toilet paper. If you underwent a bowel prep for your procedure, you may not have a normal bowel movement for a few days.  Please Note:  You might notice some irritation and congestion in your nose or some drainage.  This is from the oxygen used during your procedure.  There is no need for concern and it should clear up in a day or so.  SYMPTOMS TO REPORT IMMEDIATELY:   Following lower endoscopy (colonoscopy or flexible sigmoidoscopy):  Excessive amounts of blood in the stool  Significant tenderness or worsening of abdominal pains  Swelling of the abdomen that is new, acute  Fever of 100F or higher   For urgent or emergent issues, a gastroenterologist can be reached at any hour by calling [phone].   DIET:  We do recommend a small meal at first, but then you may proceed to your regular  diet.  Drink plenty of fluids but you should avoid alcoholic beverages for 24 hours.  ACTIVITY:  You should plan to take it easy for the rest of today and you should NOT DRIVE or use heavy machinery until tomorrow (because of the sedation medicines used during the test).    FOLLOW UP: Our staff will call the number listed on your records the next business day following your procedure to check on you and address any questions or concerns that you may have regarding the information given to you following your procedure. If we do not reach you, we will leave a message.  However, if you are feeling well and you are not experiencing any problems, there is no need to return our call.  We will assume that you have returned to your regular daily activities without incident.  If any biopsies were taken you will be contacted by phone or by letter within the next 1-3 weeks.  Please call us at [phone] if you have not heard about the biopsies in 3 weeks.    SIGNATURES/CONFIDENTIALITY: You and/or your care partner have signed paperwork which will be entered into your electronic medical record.  These signatures attest to the fact that that the information above on your After Visit Summary has been reviewed and is understood.  Full responsibility of the confidentiality of this discharge information lies with you and/or your care-partner.

## 2016-09-19 ENCOUNTER — Telehealth: Payer: Self-pay | Admitting: *Deleted

## 2016-09-19 NOTE — Telephone Encounter (Signed)
  Follow up Call-  Call back number 09/18/2016  Post procedure Call Back phone  # [phone]  Permission to leave phone message Yes  Some recent data might be hidden     Patient questions:  Do you have a fever, pain , or abdominal swelling? No. Pain Score  0 *  Have you tolerated food without any problems? Yes.    Have you been able to return to your normal activities? Yes.    Do you have any questions about your discharge instructions: Diet   No. Medications  No. Follow up visit  No.  Do you have questions or concerns about your Care? No.  Actions: * If pain score is 4 or above: No action needed, pain <4.

## 2016-09-21 ENCOUNTER — Encounter: Payer: Self-pay | Admitting: Internal Medicine

## 2016-09-21 DIAGNOSIS — Z86010 Personal history of colonic polyps: Secondary | ICD-10-CM

## 2016-09-21 DIAGNOSIS — Z860101 Personal history of adenomatous and serrated colon polyps: Secondary | ICD-10-CM

## 2016-09-21 HISTORY — DX: Personal history of colonic polyps: Z86.010

## 2016-09-21 HISTORY — DX: Personal history of adenomatous and serrated colon polyps: Z86.0101

## 2016-09-21 NOTE — Progress Notes (Signed)
10 mm adenoma recall 2021

## 2017-08-30 ENCOUNTER — Encounter: Payer: Self-pay | Admitting: Osteopathic Medicine

## 2017-08-30 ENCOUNTER — Ambulatory Visit (INDEPENDENT_AMBULATORY_CARE_PROVIDER_SITE_OTHER): Payer: 59 | Admitting: Osteopathic Medicine

## 2017-08-30 VITALS — BP 117/68 | HR 75 | Temp 98.1°F | Wt 170.2 lb

## 2017-08-30 DIAGNOSIS — R631 Polydipsia: Secondary | ICD-10-CM | POA: Diagnosis not present

## 2017-08-30 DIAGNOSIS — Z0000 Encounter for general adult medical examination without abnormal findings: Secondary | ICD-10-CM | POA: Diagnosis not present

## 2017-08-30 DIAGNOSIS — M79604 Pain in right leg: Secondary | ICD-10-CM

## 2017-08-30 DIAGNOSIS — Z131 Encounter for screening for diabetes mellitus: Secondary | ICD-10-CM | POA: Diagnosis not present

## 2017-08-30 DIAGNOSIS — Z125 Encounter for screening for malignant neoplasm of prostate: Secondary | ICD-10-CM

## 2017-08-30 NOTE — Progress Notes (Signed)
HPI: Robert Benitez is a 57 y.o. male  who presents to [place] today, 08/30/17,  for chief complaint of:  Annual Physical   Patient here for annual physical / wellness exam.  See preventive care reviewed as below.   Additional concerns today include:   Some nasal drainage w cold weather  Persistent right leg pain sometimes feels like it crossing from hip into the medial knee, sometimes feels like it is going the opposite direction from lateral knee up into the groin or around the kneecap itself.  Previously evaluated for this by Dr. Georgina Snell, overdue for follow-up.  He also notes that the right leg feels a bit colder on occasion, most noticeably when he is up in [place] for the winters, where he is attending school.  Has some concerns about circulation problems.  Ports some increased thirst and fluctuations in color of his urine   Past medical, surgical, social and family history reviewed: Patient Active Problem List   Diagnosis Date Noted  . Hx of adenomatous polyp of colon 09/21/2016  . Annual physical exam 07/20/2016  . Cardiac risk counseling 07/20/2016  . Knee pain 07/20/2016  . Paresthesia 07/20/2016  . 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 Other osteoarthritis of spine, lumbar region 05/15/2014  . Dysesthesia of multiple sites 05/15/2014  . Lesion of sciatic nerve 08/06/2013  . Other lesion of femoral nerve 08/06/2013  . Lower extremity numbness 08/06/2013   Past Surgical History:  Procedure Laterality Date  . None listed     Social History   Tobacco Use  . Smoking status: Never Smoker  . Smokeless tobacco: Never Used  Substance Use Topics  . Alcohol use: No   Family History  Problem Relation Age of Onset  . Diabetes Father   . Asthma Unknown   . Diabetes Unknown   . Colon cancer Neg Hx   . Esophageal cancer Neg Hx   . Rectal cancer Neg Hx   . Stomach cancer Neg Hx      Current medication list and allergy/intolerance  information reviewed:   Current Outpatient Medications  Medication Sig Dispense Refill  . Ascorbic Acid (VITAMIN C) 500 MG CAPS Take by mouth.    . Multiple Vitamin (THERA) TABS Take 1 tablet by mouth.     Current Facility-Administered Medications  Medication Dose Route Frequency Provider Last Rate Last Dose  . 0.9 %  sodium chloride infusion  500 mL Intravenous Continuous Gatha Mayer, MD       No Known Allergies    Review of Systems:  Constitutional:  No  fever, no chills, No recent illness, No unintentional weight changes. No significant fatigue.   HEENT: No  headache, no vision change  Cardiac: No  chest pain, No  pressure, No palpitations, No  Orthopnea  Respiratory:  No  shortness of breath. No  Cough  Gastrointestinal: No  abdominal pain, No  nausea, No  vomiting,  No  blood in stool  Musculoskeletal: No new myalgia/arthralgia -chronic issues, see HPI  Skin: No  Rash, skin does not turn mottled, red, pale with increased cold feelings as noted per HPI  Neurologic: No  weakness, No  dizziness  Psychiatric: No  concerns with depression, No  concerns with anxiety  Exam:  BP 117/68 (BP Location: Left Arm, Patient Position: Sitting, Cuff Size: Normal)   Pulse 75   Temp 98.1 F (36.7 C) (Oral)   Wt 170 lb 3.2 oz (77.2 kg)   BMI 26.66  kg/m   Constitutional: VS see above. General Appearance: alert, well-developed, well-nourished, NAD  Eyes: Normal lids and conjunctive, non-icteric sclera  Ears, Nose, Mouth, Throat: MMM, Normal external inspection ears/nares/mouth/lips/gums. TM normal bilaterally. Pharynx/tonsils no erythema, no exudate. Nasal mucosa normal.   Neck: No masses, trachea midline. No thyroid enlargement. No tenderness/mass appreciated. No lymphadenopathy  Respiratory: Normal respiratory effort. no wheeze, no rhonchi, no rales  Cardiovascular: S1/S2 normal, no murmur, no rub/gallop auscultated. RRR. No lower extremity edema.  Dorsalis pedis and  posterior tibial pulses are symmetric 2 out of 4 bilaterally  Gastrointestinal: Nontender, no masses. No hepatomegaly, no splenomegaly. No hernia appreciated. Bowel sounds normal. Rectal exam deferred.   Musculoskeletal: Gait normal. No clubbing/cyanosis of digits.   Neurological: Normal balance/coordination. No tremor. No cranial nerve deficit on limited exam. Motor and sensation intact and symmetric. Cerebellar reflexes intact.   Skin: warm, dry, intact. No rash/ulcer. No concerning nevi or subq nodules on limited exam.    Psychiatric: Normal judgment/insight. Normal mood and affect. Oriented x3.   No results found for this or any previous visit (from the past 2160 hour(s)). Lab results reviewed in detail with the patientc  ASSESSMENT/PLAN:   Annual physical exam - Plan: CBC, COMPLETE METABOLIC PANEL WITH GFR, Lipid panel, TSH, VITAMIN D 25 Hydroxy (Vit-D Deficiency, Fractures), PSA, Total with Reflex to PSA, Free, Hemoglobin A1c, Urinalysis, Routine w reflex microscopic  Prostate cancer screening - Plan: PSA, Total with Reflex to PSA, Free  Diabetes mellitus screening - Plan: COMPLETE METABOLIC PANEL WITH GFR, Hemoglobin A1c  Increased thirst - Plan: Hemoglobin A1c, Urinalysis, Routine w reflex microscopic  Pain in right leg - Pulses are fine to me today, I do not have any strong suspicion for compromised circulation, would do further testing as he is having symptoms in the winter if if needed.  He states this was previously worked up by neurology with EMG, he has documentation of some lower extremity numbness, dysesthesia/paresthesia, issues with sciatic/femoral nerve.  Would strongly consider follow-up with neurology as well if symptoms recur- Plan: CBC, COMPLETE METABOLIC PANEL WITH GFR, CK, High sensitivity CRP, Sedimentation rate   MALE PREVENTIVE CARE  updated 08/30/17  ANNUAL SCREENING/COUNSELING  Any changes to health in the past year? no  Diet/Exercise - HEALTHY HABITS  DISCUSSED TO DECREASE CV RISK Social History   Tobacco Use  Smoking Status Never Smoker  Smokeless Tobacco Never Used   Social History   Substance and Sexual Activity  Alcohol Use No   Depression screen PHQ 2/9 08/30/2017  Decreased Interest 0  Down, Depressed, Hopeless 0  PHQ - 2 Score 0  Altered sleeping 0  Tired, decreased energy 1  Change in appetite 0  Feeling bad or failure about yourself  0  Trouble concentrating 0  Moving slowly or fidgety/restless 0  Suicidal thoughts 0  PHQ-9 Score 1  Difficult doing work/chores Not difficult at all    [place]  Sexually active in the past year? - Yes with male.  STI testing needed/desired today? - no  Any concerns with testosterone/libido? - no  INFECTIOUS DISEASE SCREENING  HIV - does not need - done  GC/CT - does not need   HepC - does not need - done  TB - does not need  CANCER SCREENING  Lung - does not need  Colon - done 09/2016  Prostate -will order today  OTHER DISEASE SCREENING  Lipid - does not need  ASCVD risk: 5.2% x10 years  DM2 - does not  need  AAA - 65-75yo ever smoked: does not need  Osteoporosis - men 55yo+ - does not need  Fracture after age 29? no  Chronic steroid use? no  Smoking/Alcohol? no  Low body weight <127lb? no  Hip fracture in parent? no  Malabsorbtion, CLD, IBD, RA? no  ADULT VACCINATION  Influenza - annual vaccine recommended  Td - booster every 10 years - done 2012  Zoster - option at 38, yes at 60+   PCV13 - was not indicated  PPSV23 - was not indicated Immunization History  Administered Date(s) Administered  . Influenza Split 03/17/2011  . Tdap 03/17/2011       Visit summary with medication list and pertinent instructions was printed for patient to review. All questions at time of visit were answered - patient instructed to contact office with any additional concerns. ER/RTC precautions were reviewed with the patient.  Follow-up plan: Return for recheck w/ Dr Georgina Snell for knee and leg pain if needed. Annual check-up in one year w/ Dr A .

## 2017-08-31 ENCOUNTER — Encounter: Payer: Self-pay | Admitting: Osteopathic Medicine

## 2017-08-31 DIAGNOSIS — R631 Polydipsia: Secondary | ICD-10-CM | POA: Diagnosis not present

## 2017-08-31 DIAGNOSIS — M79604 Pain in right leg: Secondary | ICD-10-CM | POA: Diagnosis not present

## 2017-08-31 DIAGNOSIS — Z0000 Encounter for general adult medical examination without abnormal findings: Secondary | ICD-10-CM | POA: Diagnosis not present

## 2017-08-31 DIAGNOSIS — Z125 Encounter for screening for malignant neoplasm of prostate: Secondary | ICD-10-CM | POA: Diagnosis not present

## 2017-09-03 LAB — URINALYSIS, ROUTINE W REFLEX MICROSCOPIC
BILIRUBIN URINE: NEGATIVE
Glucose, UA: NEGATIVE
HGB URINE DIPSTICK: NEGATIVE
Ketones, ur: NEGATIVE
Leukocytes, UA: NEGATIVE
Nitrite: NEGATIVE
PROTEIN: NEGATIVE
Specific Gravity, Urine: 1.013 (ref 1.001–1.03)
pH: 8 (ref 5.0–8.0)

## 2017-09-03 LAB — COMPLETE METABOLIC PANEL WITH GFR
AG Ratio: 1.5 (calc) (ref 1.0–2.5)
ALBUMIN MSPROF: 4.1 g/dL (ref 3.6–5.1)
ALT: 15 U/L (ref 9–46)
AST: 14 U/L (ref 10–35)
Alkaline phosphatase (APISO): 59 U/L (ref 40–115)
BUN: 9 mg/dL (ref 7–25)
CALCIUM: 9.1 mg/dL (ref 8.6–10.3)
CO2: 28 mmol/L (ref 20–32)
CREATININE: 1.07 mg/dL (ref 0.70–1.33)
Chloride: 106 mmol/L (ref 98–110)
GFR, EST NON AFRICAN AMERICAN: 78 mL/min/{1.73_m2} (ref >=60)
GFR, Est African American: 90 mL/min/{1.73_m2} (ref >=60)
GLOBULIN: 2.7 g/dL (ref 1.9–3.7)
Glucose, Bld: 88 mg/dL (ref 65–99)
Potassium: 5 mmol/L (ref 3.5–5.3)
SODIUM: 139 mmol/L (ref 135–146)
Total Bilirubin: 0.7 mg/dL (ref 0.2–1.2)
Total Protein: 6.8 g/dL (ref 6.1–8.1)

## 2017-09-03 LAB — LIPID PANEL
CHOL/HDL RATIO: 4.7 (calc) (ref <5.0)
CHOLESTEROL: 188 mg/dL (ref <200)
HDL: 40 mg/dL — AB (ref >40)
LDL CHOLESTEROL (CALC): 125 mg/dL — AB
Non-HDL Cholesterol (Calc): 148 mg/dL (calc) — ABNORMAL HIGH (ref <130)
TRIGLYCERIDES: 115 mg/dL (ref <150)

## 2017-09-03 LAB — CBC
HEMATOCRIT: 45.6 % (ref 38.5–50.0)
HEMOGLOBIN: 16.1 g/dL (ref 13.2–17.1)
MCH: 29.4 pg (ref 27.0–33.0)
MCHC: 35.3 g/dL (ref 32.0–36.0)
MCV: 83.2 fL (ref 80.0–100.0)
MPV: 10.6 fL (ref 7.5–12.5)
Platelets: 204 10*3/uL (ref 140–400)
RBC: 5.48 10*6/uL (ref 4.20–5.80)
RDW: 13 % (ref 11.0–15.0)
WBC: 4.7 10*3/uL (ref 3.8–10.8)

## 2017-09-03 LAB — PSA, TOTAL WITH REFLEX TO PSA, FREE: PSA, Total: 0.8 ng/mL (ref <=4.0)

## 2017-09-03 LAB — VITAMIN D 25 HYDROXY (VIT D DEFICIENCY, FRACTURES): Vit D, 25-Hydroxy: 34 ng/mL (ref 30–100)

## 2017-09-03 LAB — SEDIMENTATION RATE: SED RATE: 2 mm/h (ref 0–20)

## 2017-09-03 LAB — CK: CK TOTAL: 96 U/L (ref 44–196)

## 2017-09-03 LAB — HIGH SENSITIVITY CRP: hs-CRP: 1.8 mg/L

## 2017-09-03 LAB — HEMOGLOBIN A1C
Hgb A1c MFr Bld: 5.4 % of total Hgb (ref <5.7)
Mean Plasma Glucose: 108 (calc)
eAG (mmol/L): 6 (calc)

## 2017-09-03 LAB — TSH: TSH: 1.58 m[IU]/L (ref 0.40–4.50)

## 2017-09-04 ENCOUNTER — Telehealth: Payer: Self-pay | Admitting: Osteopathic Medicine

## 2017-09-04 NOTE — Telephone Encounter (Signed)
-----   Message from Emeterio Reeve, DO sent at 08/30/2017  3:44 PM EDT ----- Regarding: shingles shot shingrix list plz thx

## 2017-09-04 NOTE — Telephone Encounter (Signed)
Added

## 2017-11-16 ENCOUNTER — Ambulatory Visit (INDEPENDENT_AMBULATORY_CARE_PROVIDER_SITE_OTHER): Payer: 59 | Admitting: Osteopathic Medicine

## 2017-11-16 VITALS — Temp 98.0°F

## 2017-11-16 DIAGNOSIS — Z23 Encounter for immunization: Secondary | ICD-10-CM

## 2017-11-16 NOTE — Progress Notes (Signed)
   Subjective:    Patient ID: Robert Benitez, male    DOB: [birth year], 57 y.o.   MRN: [id number]  HPI  Lemuel is here for the first Shingrix vaccine. Denies any problems in the past with vaccines.   Review of Systems     Objective:   Physical Exam        Assessment & Plan:  Patient tolerated injection well without complications. Patient advised to schedule next injection between 2-6 months from today.

## 2018-03-04 ENCOUNTER — Ambulatory Visit: Payer: 59

## 2018-03-22 ENCOUNTER — Ambulatory Visit (INDEPENDENT_AMBULATORY_CARE_PROVIDER_SITE_OTHER): Payer: 59 | Admitting: Family Medicine

## 2018-03-22 VITALS — BP 107/60 | HR 57 | Temp 97.6°F | Wt 172.0 lb

## 2018-03-22 DIAGNOSIS — Z23 Encounter for immunization: Secondary | ICD-10-CM | POA: Diagnosis not present

## 2018-03-22 NOTE — Progress Notes (Signed)
Pt in today for shingrix vaccine. This is 2 of 2 of the shingrix series. Vitals taken and no fever noted. Vaccine was given in right deltoid. Pt tolerated well with no immediate complications. 

## 2018-03-22 NOTE — Progress Notes (Signed)
Agree with documentation as above.   Catherine Metheney, MD  

## 2018-05-23 DIAGNOSIS — R631 Polydipsia: Secondary | ICD-10-CM | POA: Diagnosis not present

## 2018-05-23 DIAGNOSIS — Z23 Encounter for immunization: Secondary | ICD-10-CM | POA: Diagnosis not present

## 2018-05-23 DIAGNOSIS — G5791 Unspecified mononeuropathy of right lower limb: Secondary | ICD-10-CM | POA: Diagnosis not present

## 2018-05-23 DIAGNOSIS — H6121 Impacted cerumen, right ear: Secondary | ICD-10-CM | POA: Diagnosis not present

## 2018-05-23 DIAGNOSIS — H9203 Otalgia, bilateral: Secondary | ICD-10-CM | POA: Diagnosis not present

## 2018-05-23 DIAGNOSIS — Z6826 Body mass index (BMI) 26.0-26.9, adult: Secondary | ICD-10-CM | POA: Diagnosis not present

## 2019-02-26 ENCOUNTER — Ambulatory Visit (INDEPENDENT_AMBULATORY_CARE_PROVIDER_SITE_OTHER): Payer: 59 | Admitting: Osteopathic Medicine

## 2019-02-26 ENCOUNTER — Other Ambulatory Visit: Payer: Self-pay

## 2019-02-26 ENCOUNTER — Encounter: Payer: Self-pay | Admitting: Osteopathic Medicine

## 2019-02-26 VITALS — BP 95/62 | HR 90 | Temp 98.1°F | Wt 172.0 lb

## 2019-02-26 DIAGNOSIS — M79606 Pain in leg, unspecified: Secondary | ICD-10-CM | POA: Diagnosis not present

## 2019-02-26 DIAGNOSIS — R209 Unspecified disturbances of skin sensation: Secondary | ICD-10-CM | POA: Diagnosis not present

## 2019-02-26 DIAGNOSIS — G8929 Other chronic pain: Secondary | ICD-10-CM | POA: Diagnosis not present

## 2019-02-26 DIAGNOSIS — R202 Paresthesia of skin: Secondary | ICD-10-CM | POA: Diagnosis not present

## 2019-02-26 DIAGNOSIS — M25561 Pain in right knee: Secondary | ICD-10-CM

## 2019-02-26 DIAGNOSIS — R208 Other disturbances of skin sensation: Secondary | ICD-10-CM | POA: Diagnosis not present

## 2019-02-26 DIAGNOSIS — Z23 Encounter for immunization: Secondary | ICD-10-CM | POA: Diagnosis not present

## 2019-02-26 NOTE — Progress Notes (Signed)
HPI: Robert Benitez is a 58 y.o. male who  has a past medical history of Anxiety, Depression, adenomatous polyp of colon (09/21/2016), Sciatica, and Seasonal allergies.  he presents to [facility] today, 02/26/19,  for chief complaint of:  Leg pain   R leg pain and jerking Chronic, 30+ years altogether Worse w/ cold weather  Usually feels like it starts in the R groin, goes DOWN to cause jerking just above knee cap, when it gets to the ankle it feels like "fire" type pain. When it's cold, though pain seems to start in toe and go UP.   Has seen sports med for knee pain, R knee pain and paresthesia responded really well to physical therapy - reported improvement/resolution of symptoms as of 07/2016  From sports med note 07/21/2018: "He had two EMGs and an MRI in the past.  Massage, gabapentin have helped in the past.  He has had similar pain as a [place].  He felt paraesthesias in his cervical neck and shoulders.  This no longer bothers him.  He is not sure about any childhood illness or fevers.  Malaria was common in the area.  He got the BCG vaccine and the CXRs for TB have always been negative...    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 Mild spondylitic changes at L5-S1. EMG/[facility] on 03/07/12 without evidence of large fiber peripheral neuropathy or right lumbosacral radiculopathy. He never had labs done for further testing. He did not find gabapentin to be beneficial however he never titrated the medication beyond 100 mg." A/P recommends walking for exercise and f/u 6 mos. Visit 05/2014 - gabapentin recommended, mentioned other emds such as Lyrica or topical treatment  "Repeat EMG 05/2014: "1. No electrophysiologic findings to explain his right anterior thigh sensations or his right medial lower  leg paresthesias. No evidence for acute/ongoing radiculopathy, plexopathy or polyneuropathy.  2. 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 However, this does not explain his clinical symptoms and so appears to be more of an incidental finding. The relative decrease in right peroneal motor amplitude potential as compared to the left side could be consistent with his remote history of sciatica.  3. Clinical correlation recommended.""    At today's visit 02/26/19 ... PMH, PSH, FH reviewed and updated as needed.  Current medication list and allergy/intolerance hx reviewed and updated as needed. (See remainder of HPI, ROS, Phys Exam below)   No results found.  No results found for this or any previous visit (from the past 72 hour(s)).  BP Readings from Last 3 Encounters:  02/26/19 95/62  03/22/18 107/60  08/30/17 117/68         ASSESSMENT/PLAN: The primary encounter diagnosis was Dysesthesia of multiple sites. Diagnoses of Need for influenza vaccination, Paresthesia, Chronic pain of right knee, and Painful and cold lower extremity were also pertinent to this visit.    Orders Placed This Encounter  Procedures  . Flu Vaccine QUAD 6+ mos PF IM (Fluarix Quad PF)  . VAS [place] ABI WITH/WO TBI       Patient Instructions  Plan:  Will get ABI to test circulation - you should get a call to schedule this test at the [facility]   If that's totally normal, might consider labs and repeat MRI  If you would like to get  on medications for pain (Lyrica or similar) let me know!          Follow-up plan: Return for RECHECK PENDING CIRCULATION TEST RESULTS / IF WORSE OR  CHANGE.                                                 ################################################# ################################################# ################################################# #################################################    Current Meds  Medication Sig  . Ascorbic Acid (VITAMIN C) 500 MG CAPS Take by mouth.  . Multiple Vitamin (THERA) TABS Take 1 tablet by mouth.   Current Facility-Administered Medications for the 02/26/19 encounter (Office Visit) with Emeterio Reeve, DO  Medication  . 0.9 %  sodium chloride infusion    No Known Allergies     Review of Systems:  Constitutional: No recent illness  HEENT: No  headache, no vision change  Cardiac: No  chest pain, No  pressure, No palpitations  Respiratory:  No  shortness of breath. No  Cough  Gastrointestinal: No  abdominal pain, no change on bowel habits  Musculoskeletal: +new myalgia/arthralgia  Psychiatric: No  concerns with depression, No  concerns with anxiety  Exam:  BP 95/62 (BP Location: Left Arm, Patient Position: Sitting, Cuff Size: Normal)   Pulse 90   Temp 98.1 F (36.7 C) (Oral)   Wt 172 lb 0.6 oz (78 kg)   BMI 26.95 kg/m   Constitutional: VS see above. General Appearance: alert, well-developed, well-nourished, NAD  Eyes: Normal lids and conjunctive, non-icteric sclera  Neck: No masses, trachea midline.   Respiratory: Normal respiratory effort. no wheeze, no rhonchi, no rales  Cardiovascular: S1/S2 normal, no murmur, no rub/gallop auscultated. RRR.   Musculoskeletal: Gait normal. Symmetric and independent movement of all extremities. Normal strength/sensation to knee flexion/extension and ankle plantar/dorsifelxion and hip flexion all against resistance, no homans sign, no edema, normal pulses DP/PT  Neurological: Normal balance/coordination. No tremor.  Skin: warm, dry, intact.   Psychiatric: Normal  judgment/insight. Normal mood and affect. Oriented x3.       Visit summary with medication list and pertinent instructions was printed for patient to review, patient was advised to alert [place] if any updates are needed. All questions at time of visit were answered - patient instructed to contact office with any additional concerns. ER/RTC precautions were reviewed with the patient and understanding verbalized.   Note: Total time spent 25 minutes, greater than 50% of the visit was spent face-to-face counseling and coordinating care for the following: The primary encounter diagnosis was Dysesthesia of multiple sites. Diagnoses of Need for influenza vaccination, Paresthesia, Chronic pain of right knee, and Painful and cold lower extremity were also pertinent to this visit.[name]  Please note: voice recognition software was used to produce this document, and typos may escape review. Please contact Dr. Sheppard Coil for any needed clarifications.    Follow up plan: Return for RECHECK PENDING CIRCULATION TEST RESULTS / IF WORSE OR CHANGE.

## 2019-02-26 NOTE — Patient Instructions (Signed)
Plan:  Will get ABI to test circulation - you should get a call to schedule this test at the [facility]   If that's totally normal, might consider labs and repeat MRI  If you would like to get on medications for pain (Lyrica or similar) let me know!

## 2019-02-27 ENCOUNTER — Telehealth: Payer: 59 | Admitting: Family Medicine

## 2019-03-06 ENCOUNTER — Encounter (HOSPITAL_BASED_OUTPATIENT_CLINIC_OR_DEPARTMENT_OTHER): Payer: 59

## 2019-03-17 ENCOUNTER — Other Ambulatory Visit: Payer: Self-pay

## 2019-03-17 ENCOUNTER — Ambulatory Visit (HOSPITAL_BASED_OUTPATIENT_CLINIC_OR_DEPARTMENT_OTHER)
Admission: RE | Admit: 2019-03-17 | Discharge: 2019-03-17 | Disposition: A | Discharge status: Home / Self Care | Payer: 59 | Source: Ambulatory Visit | Attending: Osteopathic Medicine | Admitting: Osteopathic Medicine

## 2019-03-17 DIAGNOSIS — R202 Paresthesia of skin: Secondary | ICD-10-CM | POA: Diagnosis not present

## 2019-03-17 DIAGNOSIS — R209 Unspecified disturbances of skin sensation: Secondary | ICD-10-CM | POA: Insufficient documentation

## 2019-03-17 DIAGNOSIS — M79606 Pain in leg, unspecified: Secondary | ICD-10-CM | POA: Diagnosis not present

## 2019-03-17 NOTE — Progress Notes (Signed)
Vascular ABI has been completed, results are located under CV Proc.  Darlina Sicilian RDCS

## 2019-03-31 ENCOUNTER — Ambulatory Visit: Payer: 59 | Attending: Internal Medicine

## 2019-03-31 DIAGNOSIS — Z20822 Contact with and (suspected) exposure to covid-19: Secondary | ICD-10-CM | POA: Diagnosis not present

## 2019-04-01 LAB — NOVEL CORONAVIRUS, NAA: SARS-CoV-2, NAA: NOT DETECTED

## 2019-09-18 DIAGNOSIS — Z23 Encounter for immunization: Secondary | ICD-10-CM | POA: Diagnosis not present

## 2019-10-08 DIAGNOSIS — Z23 Encounter for immunization: Secondary | ICD-10-CM | POA: Diagnosis not present

## 2019-10-22 ENCOUNTER — Other Ambulatory Visit: Payer: Self-pay

## 2019-10-22 ENCOUNTER — Ambulatory Visit (INDEPENDENT_AMBULATORY_CARE_PROVIDER_SITE_OTHER): Payer: 59 | Admitting: Osteopathic Medicine

## 2019-10-22 ENCOUNTER — Encounter: Payer: Self-pay | Admitting: Osteopathic Medicine

## 2019-10-22 VITALS — BP 99/63 | HR 80 | Temp 98.0°F | Ht 66.0 in | Wt 171.0 lb

## 2019-10-22 DIAGNOSIS — Z86010 Personal history of colonic polyps: Secondary | ICD-10-CM

## 2019-10-22 DIAGNOSIS — Z0000 Encounter for general adult medical examination without abnormal findings: Secondary | ICD-10-CM | POA: Diagnosis not present

## 2019-10-22 NOTE — Progress Notes (Signed)
Robert Benitez is a 59 y.o. male who presents to  [place] [facility]  today, 10/22/19, seeking care for the following:  . Annual physical      ASSESSMENT & PLAN with other pertinent findings:  The primary encounter diagnosis was Annual physical exam. A diagnosis of Hx of adenomatous polyp of colon was also pertinent to this visit.    Patient Instructions  General Preventive Care  Most recent routine screening labs: ordered today.   Blood pressure goal 130/80 or less.   Tobacco: don't! Alcohol: responsible moderation is ok for most adults - if you have concerns about your alcohol intake, please talk to me!   Exercise: as tolerated to reduce risk of cardiovascular disease and diabetes. Strength training will also prevent osteoporosis.   Mental health: if need for mental health care (medicines, counseling, other), or concerns about moods, please let me know!   Sexual / Reproductive health: if need for STD testing, or if concerns with libido/pain problems, please let me know!   Advanced Directive: Living Will and/or Healthcare Power of Attorney recommended for all adults, regardless of age or health.  Vaccines  Flu vaccine: recommended every fall.   Shingles vaccine: all done!  Pneumonia vaccines: after age 62  Tetanus booster: every 10 years - due 2022  COVID vaccine: THANKS for getting your vaccine! :)  Cancer screenings   Colon cancer screening: Colonoscopy as directed by GI - due 2021  Prostate cancer screening: PSA blood test age 21-71  Lung cancer screening: not needed for non-smokers  Infection screenings  . HIV: recommended screening at least once age 69-65 . Gonorrhea/Chlamydia: screening as needed . Hepatitis C: recommended once for everyone age 35-75 . TB: certain at-risk populations, or depending on work requirements and/or travel history Other . Bone Density Test: recommended for men at age  72 . Abdominal Aortic Aneurysm: screening with ultrasound recommended once for men age 17-75 who have ever smoked    Orders Placed This Encounter  Procedures  . CBC  . COMPLETE METABOLIC PANEL WITH GFR  . Lipid panel  . Ambulatory referral to Gastroenterology    No orders of the defined types were placed in this encounter.   Constitutional:  . VSS, see nurse notes . General Appearance: alert, well-developed, well-nourished, NAD Neck: . No masses, trachea midline . No thyroid enlargement/tenderness/mass appreciated Respiratory: . Normal respiratory effort . Breath sounds normal, no wheeze/rhonchi/rales Cardiovascular: . S1/S2 normal, no murmur/rub/gallop auscultated . No lower extremity edema Gastrointestinal: . Nontender, no masses . No hepatomegaly, no splenomegaly . No hernia appreciated Musculoskeletal:  . Gait normal . No clubbing/cyanosis of digits Neurological: . No cranial nerve deficit on limited exam . Motor and sensation intact and symmetric Psychiatric: . Normal judgment/insight . Normal mood and affect    Follow-up instructions: Return in about 1 year (around 10/21/2020) for [place] (call week prior to visit for lab orders).                                         BP 99/63 (BP Location: Left Arm, Patient Position: Sitting)   Pulse 80   Temp 98 F (36.7 C)   Ht 5\' 6"  (1.676 m)   Wt 171 lb (77.6 kg)   SpO2 96%   BMI 27.60 kg/m   Current Meds  Medication Sig  . Ascorbic Acid (VITAMIN C)  500 MG CAPS Take by mouth.  . Multiple Vitamin (THERA) TABS Take 1 tablet by mouth.   Current Facility-Administered Medications for the 10/22/19 encounter (Office Visit) with Emeterio Reeve, DO  Medication  . 0.9 %  sodium chloride infusion    No results found for this or any previous visit (from the past 72 hour(s)).  No results found.     All questions at time of visit were answered - patient instructed to contact  office with any additional concerns or updates.  ER/RTC precautions were reviewed with the patient as applicable.   Please note: voice recognition software was used to produce this document, and typos may escape review. Please contact Dr. Sheppard Coil for any needed clarifications.

## 2019-10-22 NOTE — Patient Instructions (Addendum)
General Preventive Care  Most recent routine screening labs: ordered today.   Blood pressure goal 130/80 or less.   Tobacco: don't! Alcohol: responsible moderation is ok for most adults - if you have concerns about your alcohol intake, please talk to me!   Exercise: as tolerated to reduce risk of cardiovascular disease and diabetes. Strength training will also prevent osteoporosis.   Mental health: if need for mental health care (medicines, counseling, other), or concerns about moods, please let me know!   Sexual / Reproductive health: if need for STD testing, or if concerns with libido/pain problems, please let me know!   Advanced Directive: Living Will and/or Healthcare Power of Attorney recommended for all adults, regardless of age or health.  Vaccines  Flu vaccine: recommended every fall.   Shingles vaccine: all done!  Pneumonia vaccines: after age 66  Tetanus booster: every 10 years - due 2022  COVID vaccine: THANKS for getting your vaccine! :)  Cancer screenings   Colon cancer screening: Colonoscopy as directed by GI - due 2021  Prostate cancer screening: PSA blood test age 41-71  Lung cancer screening: not needed for non-smokers  Infection screenings   HIV: recommended screening at least once age 64-65  Gonorrhea/Chlamydia: screening as needed  Hepatitis C: recommended once for everyone age 32-44  TB: certain at-risk populations, or depending on work requirements and/or travel history Other  Bone Density Test: recommended for men at age 34  Abdominal Aortic Aneurysm: screening with ultrasound recommended once for men age 78-75 who have ever smoked

## 2019-10-24 DIAGNOSIS — Z0000 Encounter for general adult medical examination without abnormal findings: Secondary | ICD-10-CM | POA: Diagnosis not present

## 2019-10-25 LAB — COMPLETE METABOLIC PANEL WITH GFR
AG Ratio: 1.6 (calc) (ref 1.0–2.5)
ALT: 17 U/L (ref 9–46)
AST: 17 U/L (ref 10–35)
Albumin: 4.4 g/dL (ref 3.6–5.1)
Alkaline phosphatase (APISO): 65 U/L (ref 35–144)
BUN: 14 mg/dL (ref 7–25)
CO2: 21 mmol/L (ref 20–32)
Calcium: 9.5 mg/dL (ref 8.6–10.3)
Chloride: 103 mmol/L (ref 98–110)
Creat: 1.08 mg/dL (ref 0.70–1.33)
GFR, Est African American: 88 mL/min/{1.73_m2} (ref >=60)
GFR, Est Non African American: 76 mL/min/{1.73_m2} (ref >=60)
Globulin: 2.8 g/dL (calc) (ref 1.9–3.7)
Glucose, Bld: 90 mg/dL (ref 65–99)
Potassium: 4.6 mmol/L (ref 3.5–5.3)
Sodium: 138 mmol/L (ref 135–146)
Total Bilirubin: 0.6 mg/dL (ref 0.2–1.2)
Total Protein: 7.2 g/dL (ref 6.1–8.1)

## 2019-10-25 LAB — CBC
HCT: 49.6 % (ref 38.5–50.0)
Hemoglobin: 17 g/dL (ref 13.2–17.1)
MCH: 29 pg (ref 27.0–33.0)
MCHC: 34.3 g/dL (ref 32.0–36.0)
MCV: 84.5 fL (ref 80.0–100.0)
MPV: 10.6 fL (ref 7.5–12.5)
Platelets: 213 10*3/uL (ref 140–400)
RBC: 5.87 10*6/uL — ABNORMAL HIGH (ref 4.20–5.80)
RDW: 12.7 % (ref 11.0–15.0)
WBC: 4.8 10*3/uL (ref 3.8–10.8)

## 2019-10-25 LAB — LIPID PANEL
Cholesterol: 212 mg/dL — ABNORMAL HIGH (ref <200)
HDL: 46 mg/dL (ref >=40)
LDL Cholesterol (Calc): 143 mg/dL (calc) — ABNORMAL HIGH
Non-HDL Cholesterol (Calc): 166 mg/dL (calc) — ABNORMAL HIGH (ref <130)
Total CHOL/HDL Ratio: 4.6 (calc) (ref <5.0)
Triglycerides: 110 mg/dL (ref <150)

## 2019-10-27 LAB — PSA, TOTAL WITH REFLEX TO PSA, FREE: PSA, Total: 0.8 ng/mL (ref <=4.0)

## 2019-11-03 ENCOUNTER — Ambulatory Visit
Admission: RE | Admit: 2019-11-03 | Discharge: 2019-11-03 | Disposition: A | Discharge status: Home / Self Care | Payer: 59 | Source: Ambulatory Visit | Attending: Emergency Medicine | Admitting: Emergency Medicine

## 2019-11-03 ENCOUNTER — Emergency Department
Admission: EM | Admit: 2019-11-03 | Discharge: 2019-11-03 | Disposition: A | Discharge status: Home / Self Care | Payer: 59 | Source: Home / Self Care

## 2019-11-03 ENCOUNTER — Other Ambulatory Visit: Payer: Self-pay

## 2019-11-03 ENCOUNTER — Encounter: Payer: Self-pay | Admitting: Emergency Medicine

## 2019-11-03 ENCOUNTER — Encounter: Payer: Self-pay | Admitting: Osteopathic Medicine

## 2019-11-03 ENCOUNTER — Other Ambulatory Visit: Payer: Self-pay | Admitting: Emergency Medicine

## 2019-11-03 DIAGNOSIS — N6489 Other specified disorders of breast: Secondary | ICD-10-CM

## 2019-11-03 DIAGNOSIS — R928 Other abnormal and inconclusive findings on diagnostic imaging of breast: Secondary | ICD-10-CM | POA: Diagnosis not present

## 2019-11-03 DIAGNOSIS — N62 Hypertrophy of breast: Secondary | ICD-10-CM | POA: Diagnosis not present

## 2019-11-03 DIAGNOSIS — N6310 Unspecified lump in the right breast, unspecified quadrant: Secondary | ICD-10-CM

## 2019-11-03 NOTE — ED Provider Notes (Signed)
[facility]    CSN: [id number] Arrival date & time: 11/03/19  0806      History   Chief Complaint Chief Complaint  Patient presents with  . Muscle Pain    HPI Robert Benitez is a 59 y.o. male.   HPI  Robert Benitez is a 59 y.o. male presenting to UC with c/o Right side breast pain where he noticed a small lump near his nipple about 1 week ago. Pain is sore, worse with he touches the lump. Denies changes in skin color or texture. No nipple discharge. No family hx of cancer. Denies fever, chills, n/v/d. Pt states he is leaving for [place] in 2 days to go back to school. He sees Dr. Sheppard Coil as his PCP but she was not in the office today.    Past Medical History:  Diagnosis Date  . Anxiety   . Depression   . Hx of adenomatous polyp of colon 09/21/2016  . Sciatica   . Seasonal allergies     Patient Active Problem List   Diagnosis Date Noted  . Hx of adenomatous polyp of colon 09/21/2016  . Annual physical exam 07/20/2016  . Cardiac risk counseling 07/20/2016  . Knee pain 07/20/2016  . Paresthesia 07/20/2016  . 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 Other osteoarthritis of spine, lumbar region 05/15/2014  . Dysesthesia of multiple sites 05/15/2014  . Lesion of sciatic nerve 08/06/2013  . Other lesion of femoral nerve 08/06/2013    Past Surgical History:  Procedure Laterality Date  . None listed         Home Medications    Prior to Admission medications   Medication Sig Start Date End Date Taking? Authorizing Provider  Ascorbic Acid (VITAMIN C) 500 MG CAPS Take by mouth.    [provider]  Multiple Vitamin (THERA) TABS Take 1 tablet by mouth.    [provider]    Family History Family History  Problem Relation Age of Onset  . Diabetes Father   . Asthma Other   . Diabetes Other   . Colon cancer Neg Hx   . Esophageal cancer Neg Hx   . Rectal cancer Neg Hx   . Stomach cancer Neg Hx     Social History Social History   Tobacco Use   . Smoking status: Never Smoker  . Smokeless tobacco: Never Used  Substance Use Topics  . Alcohol use: No  . Drug use: No     Allergies   Patient has no known allergies.   Review of Systems Review of Systems  Constitutional: Negative for chills, fever and unexpected weight change.  HENT: Negative for congestion.   Respiratory: Negative for cough and shortness of breath.   Cardiovascular: Positive for chest pain (right breast).  Skin: Negative for color change and wound.     Physical Exam Triage Vital Signs ED Triage Vitals  Enc Vitals Group     BP 11/03/19 0822 112/74     Pulse Rate 11/03/19 0822 65     Resp --      Temp 11/03/19 0822 98.5 F (36.9 C)     Temp Source 11/03/19 0822 Oral     SpO2 11/03/19 0822 97 %     Weight 11/03/19 0823 172 lb (78 kg)     Height 11/03/19 0823 5\' 7"  (1.702 m)     Head Circumference --      Peak Flow --      Pain Score 11/03/19 0823 3  Pain Loc --      Pain Edu? --      Excl. in [place]? --    No data found.  Updated Vital Signs BP 112/74 (BP Location: Right Arm)   Pulse 65   Temp 98.5 F (36.9 C) (Oral)   Ht 5\' 7"  (1.702 m)   Wt 172 lb (78 kg)   SpO2 97%   BMI 26.94 kg/m   Visual Acuity Right Eye Distance:   Left Eye Distance:   Bilateral Distance:    Right Eye Near:   Left Eye Near:    Bilateral Near:     Physical Exam Vitals and nursing note reviewed.  Constitutional:      Appearance: Normal appearance. He is well-developed.  HENT:     Head: Normocephalic and atraumatic.  Cardiovascular:     Rate and Rhythm: Normal rate and regular rhythm.  Pulmonary:     Effort: Pulmonary effort is normal. No respiratory distress.     Breath sounds: Normal breath sounds.  Chest:     Breasts:        Right: Mass and tenderness present. No swelling, bleeding, inverted nipple, nipple discharge or skin change.     Musculoskeletal:        General: Normal range of motion.     Cervical back: Normal range of motion.  Skin:     General: Skin is warm and dry.  Neurological:     Mental Status: He is alert and oriented to person, place, and time.  Psychiatric:        Behavior: Behavior normal.      UC Treatments / Results  Labs (all labs ordered are listed, but only abnormal results are displayed) Labs Reviewed - No data to display  EKG   Radiology No results found.  Procedures Procedures (including critical care time)  Medications Ordered in UC Medications - No data to display  Initial Impression / Assessment and Plan / UC Course  I have reviewed the triage vital signs and the nursing notes.  Pertinent labs & imaging results that were available during my care of the patient were reviewed by me and considered in my medical decision making (see chart for details).     No evidence of cellulitis. Given pt's age, recommend further evaluation of mass through imaging at the [facility] for [place] and mammogram placed. Encouraged pt to schedule an appointment. F/u with PCP  AVS given  Final Clinical Impressions(s) / UC Diagnoses   Final diagnoses:  Breast mass, right     Discharge Instructions      Please call [facility] to schedule an appointment for your ultrasound and/or mammogram. Dr. Sheppard Coil may be in the office tomorrow, you can also try to schedule a follow up with her tomorrow or keep in contact with her through your [facility] account while you are out of town.     ED Prescriptions    None     PDMP not reviewed this encounter.   Noe Gens, [place] 11/03/19 [phone]

## 2019-11-03 NOTE — Discharge Instructions (Addendum)
  Please call [facility] to schedule an appointment for your ultrasound and/or mammogram. Dr. Sheppard Coil may be in the office tomorrow, you can also try to schedule a follow up with her tomorrow or keep in contact with her through your [facility] account while you are out of town.

## 2019-11-03 NOTE — ED Triage Notes (Signed)
Rt breast pain x  1 week, says he feels a small lump.

## 2019-11-18 DIAGNOSIS — Z20822 Contact with and (suspected) exposure to covid-19: Secondary | ICD-10-CM | POA: Diagnosis not present

## 2019-12-19 NOTE — Progress Notes (Signed)
FYI

## 2020-01-22 ENCOUNTER — Ambulatory Visit (INDEPENDENT_AMBULATORY_CARE_PROVIDER_SITE_OTHER): Payer: 59 | Admitting: Osteopathic Medicine

## 2020-01-22 ENCOUNTER — Encounter: Payer: Self-pay | Admitting: Osteopathic Medicine

## 2020-01-22 VITALS — BP 117/75 | HR 66 | Temp 98.3°F | Wt 168.0 lb

## 2020-01-22 DIAGNOSIS — R0789 Other chest pain: Secondary | ICD-10-CM | POA: Diagnosis not present

## 2020-01-22 NOTE — Progress Notes (Signed)
Robert Benitez is a 59 y.o. male who presents to  [place] at [facility]  today, 01/22/20, seeking care for the following:  . Continuing breast/chest pain on R. Mammo/US engative. Describes persistent soreness around areolar region, no skin changes, no mass, constant, not worse w/ activation of pectoralis muscle, not worse w/ exertion, no skin sensitivity or rash     ASSESSMENT & PLAN with other pertinent findings:  The encounter diagnosis was Chest pain, non-cardiac. Suspect possible Shingles variant but lack of skin burning/sensitivity seems unlikely. Possible MSK issue but pain is constant regardless of movement or muscle activation. Rib pain seems unlikely. WOuld consider sport med second poinion re: MSK Dx (intercostal strain? Small hematoma but seems unlikely too). Recent mammo/US negative but consider repeat 6 mos after initial imaging unless worse in the meantime. Trial Vitamin E. Undertain if estrogen blockers would be helpful in male but possible tx tamoxifen. Soy intake has been shows to help breast pain in women, possible benefit from Vitamin E though limited data.    No results found for this or any previous visit (from the past 24 hour(s)).     Patient Instructions  Vitamin E Soy milk  Consider NSAID  Follow up in 6 mos w/ repeat mammogram/US if still bothering you (04/2020)  Consider second opinion from sports med (03/2020)      No orders of the defined types were placed in this encounter.   No orders of the defined types were placed in this encounter.      Follow-up instructions: Return for FOLLOW UP VIA [place] (SET TO SEND LATER).                                         BP 117/75 (BP Location: Left Arm, Patient Position: Sitting, Cuff Size: Normal)   Pulse 66   Temp 98.3 F (36.8 C) (Oral)   Wt 168 lb 0.6 oz (76.2 kg)   BMI 26.32 kg/m   Current Meds   Medication Sig  . Ascorbic Acid (VITAMIN C) 500 MG CAPS Take by mouth.  . Multiple Vitamin (THERA) TABS Take 1 tablet by mouth.   Current Facility-Administered Medications for the 01/22/20 encounter (Office Visit) with Emeterio Reeve, DO  Medication  . 0.9 %  sodium chloride infusion    No results found for this or any previous visit (from the past 72 hour(s)).  No results found.     All questions at time of visit were answered - patient instructed to contact office with any additional concerns or updates.  ER/RTC precautions were reviewed with the patient as applicable.   Please note: voice recognition software was used to produce this document, and typos may escape review. Please contact Dr. Sheppard Coil for any needed clarifications.   Total encounter time: 30 minutes I nface to face counseling, record review, discussion of plan, reviewing UpToDate references

## 2020-01-22 NOTE — Patient Instructions (Addendum)
Vitamin E Soy milk  Consider NSAID  Follow up in 6 mos w/ repeat mammogram/US if still bothering you (04/2020)  Consider second opinion from sports med (03/2020)

## 2020-05-06 ENCOUNTER — Other Ambulatory Visit: Payer: 59

## 2020-07-26 ENCOUNTER — Ambulatory Visit: Payer: 59

## 2020-07-26 ENCOUNTER — Other Ambulatory Visit: Payer: Self-pay

## 2020-07-26 ENCOUNTER — Ambulatory Visit
Admission: RE | Admit: 2020-07-26 | Discharge: 2020-07-26 | Disposition: A | Discharge status: Home / Self Care | Payer: 59 | Source: Ambulatory Visit | Attending: Emergency Medicine | Admitting: Emergency Medicine

## 2020-07-26 DIAGNOSIS — N6489 Other specified disorders of breast: Secondary | ICD-10-CM

## 2020-07-26 DIAGNOSIS — R928 Other abnormal and inconclusive findings on diagnostic imaging of breast: Secondary | ICD-10-CM | POA: Diagnosis not present

## 2021-04-26 ENCOUNTER — Ambulatory Visit (INDEPENDENT_AMBULATORY_CARE_PROVIDER_SITE_OTHER): Payer: Self-pay | Admitting: Medical-Surgical

## 2021-04-26 DIAGNOSIS — Z91199 Patient's noncompliance with other medical treatment and regimen due to unspecified reason: Secondary | ICD-10-CM

## 2021-04-26 NOTE — Patient Instructions (Signed)

## 2021-04-26 NOTE — Progress Notes (Signed)
   Complete physical exam  Patient: Robert Benitez   DOB: 01/07/1997   61 y.o. Male  MRN: [id number]  Subjective:    No chief complaint on file.   Robert Benitez is a 61 y.o. male who presents today for a complete physical exam. She reports consuming a {diet types:17450} diet. {types:19826} She generally feels {DESC; WELL/FAIRLY WELL/POORLY:18703}. She reports sleeping {DESC; WELL/FAIRLY WELL/POORLY:18703}. She {does/does not:200015} have additional problems to discuss today.    Most recent fall risk assessment:    09/14/2021   10:42 AM  Fall Risk   Falls in the past year? 0  Number falls in past yr: 0  Injury with Fall? 0  Risk for fall due to : No Fall Risks  Follow up Falls evaluation completed     Most recent depression screenings:    09/14/2021   10:42 AM 08/05/2020   10:46 AM  PHQ 2/9 Scores  PHQ - 2 Score 0 0  PHQ- 9 Score 5     {VISON DENTAL STD PSA (Optional):27386}  {History (Optional):23778}  Patient Care Team: Leotis Isham, NP as PCP - General (Nurse Practitioner)   Outpatient Medications Prior to Visit  Medication Sig   fluticasone (FLONASE) 50 MCG/ACT nasal spray Place 2 sprays into both nostrils in the morning and at bedtime. After 7 days, reduce to once daily.   norgestimate-ethinyl estradiol (SPRINTEC 28) 0.25-35 MG-MCG tablet Take 1 tablet by mouth daily.   Nystatin POWD Apply liberally to affected area 2 times per day   spironolactone (ALDACTONE) 100 MG tablet Take 1 tablet (100 mg total) by mouth daily.   No facility-administered medications prior to visit.    ROS        Objective:     There were no vitals taken for this visit. {Vitals History (Optional):23777}  Physical Exam   No results found for any visits on 10/20/21. {Show previous labs (optional):23779}    Assessment & Plan:    Routine Health Maintenance and Physical Exam  Immunization History  Administered Date(s) Administered   DTaP 03/23/1999, 05/19/1999,  07/28/1999, 04/12/2000, 10/27/2003   Hepatitis A 08/23/2007, 08/28/2008   Hepatitis B 01/08/1999, 02/15/1999, 07/28/1999   HiB (PRP-OMP) 03/23/1999, 05/19/1999, 07/28/1999, 04/12/2000   IPV 03/23/1999, 05/19/1999, 01/16/2000, 10/27/2003   Influenza,inj,Quad PF,6+ Mos 11/28/2013   Influenza-Unspecified 02/28/2012   MMR 01/15/2001, 10/27/2003   Meningococcal Polysaccharide 08/28/2011   Pneumococcal Conjugate-13 04/12/2000   Pneumococcal-Unspecified 07/28/1999, 10/11/1999   Tdap 08/28/2011   Varicella 01/16/2000, 08/23/2007    Health Maintenance  Topic Date Due   HIV Screening  Never done   Hepatitis C Screening  Never done   INFLUENZA VACCINE  10/18/2021   PAP-Cervical Cytology Screening  10/20/2021 (Originally 01/07/2020)   PAP SMEAR-Modifier  10/20/2021 (Originally 01/07/2020)   TETANUS/TDAP  10/20/2021 (Originally 08/27/2021)   HPV VACCINES  Discontinued   COVID-19 Vaccine  Discontinued    Discussed health benefits of physical activity, and encouraged her to engage in regular exercise appropriate for her age and condition.  Problem List Items Addressed This Visit   None Visit Diagnoses     Annual physical exam    -  Primary   Cervical cancer screening       Need for Tdap vaccination          No follow-ups on file.     Jaquavius Hudler, NP   

## 2021-07-01 IMAGING — MG MM DIGITAL DIAGNOSTIC UNILAT*R* W/ TOMO W/ CAD
6 series · 6 of 18 positions shown · non-contrast
Comparison: Previous exam(s).

CLINICAL DATA: 60-year-old male with history of known bilateral
gynecomastia presenting for six-month follow-up of a probably benign
right breast asymmetry without ultrasound correlate. The patient
states his right breast tenderness has improved/resolved in the
interim.

EXAM:
DIGITAL DIAGNOSTIC UNILATERAL RIGHT MAMMOGRAM WITH TOMOSYNTHESIS AND
CAD
TECHNIQUE: Right digital diagnostic mammography and breast tomosynthesis was
performed. The images were evaluated with computer-aided detection.

[R MLO synth-2D]
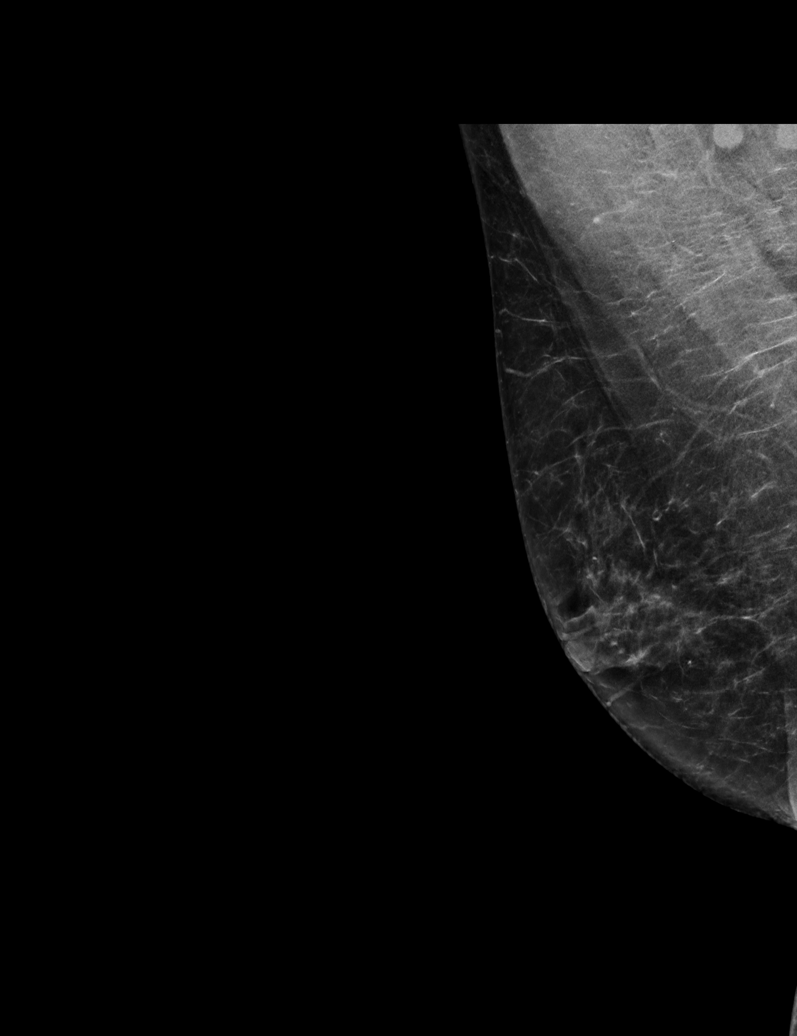

[R CC synth-2D (1 of 2)]
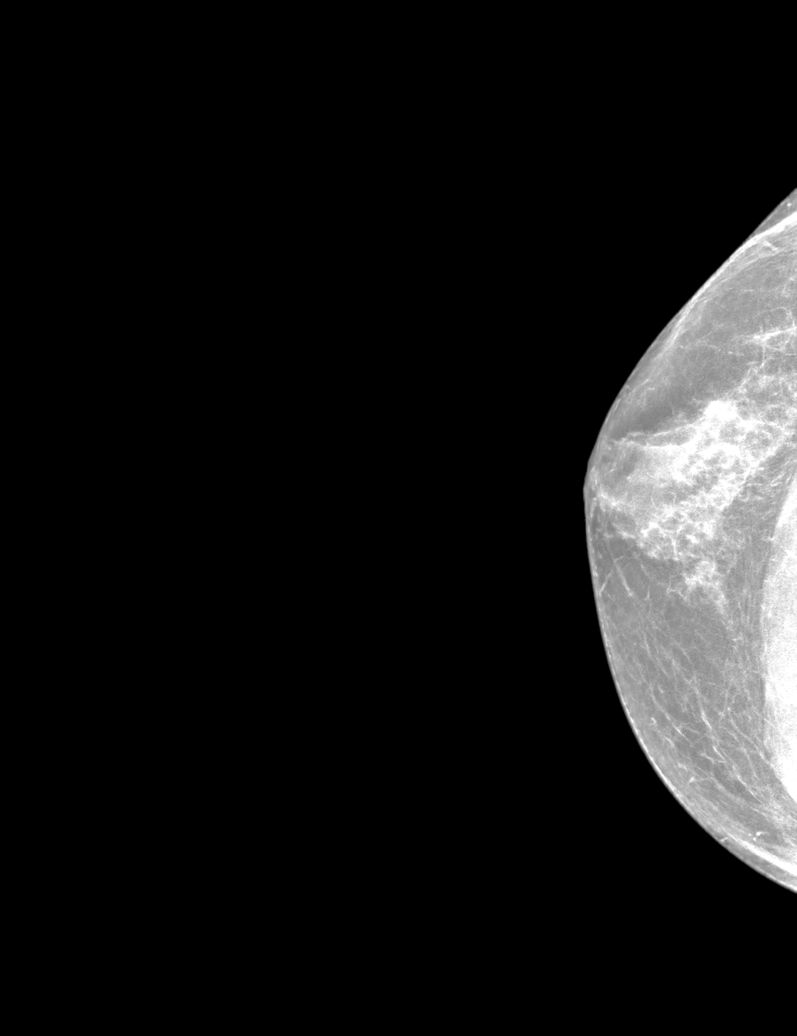

[R CC synth-2D (2 of 2)]
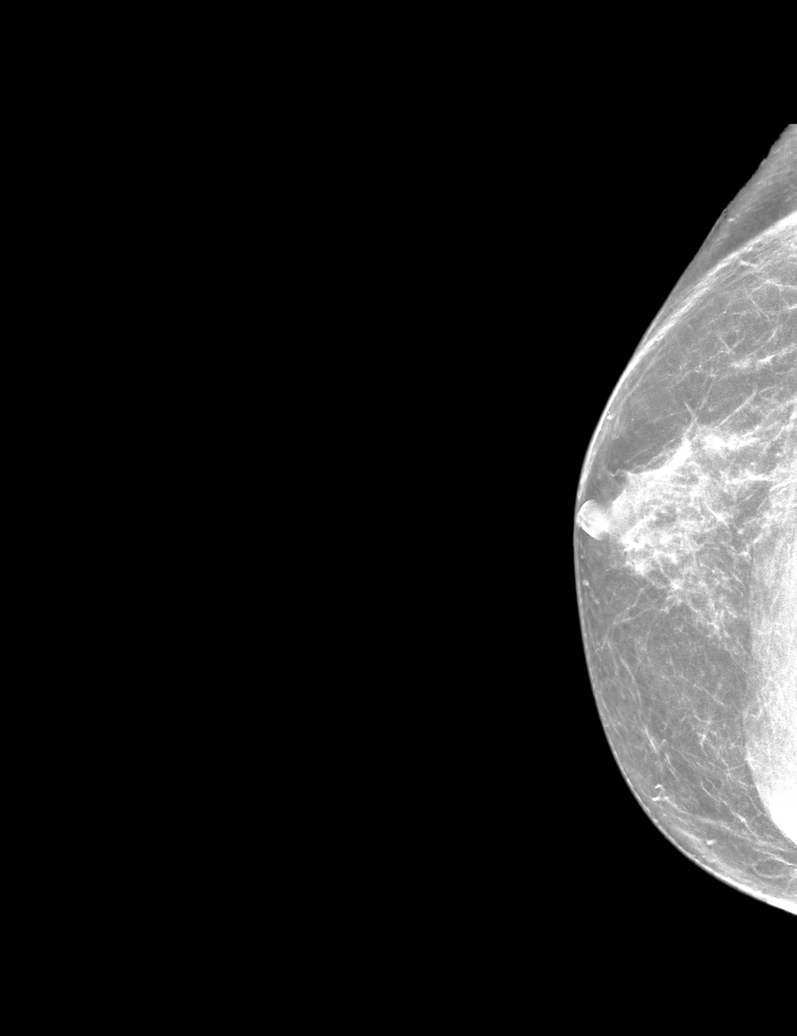

[R MLO tomo · tomo slice 29/58.0]
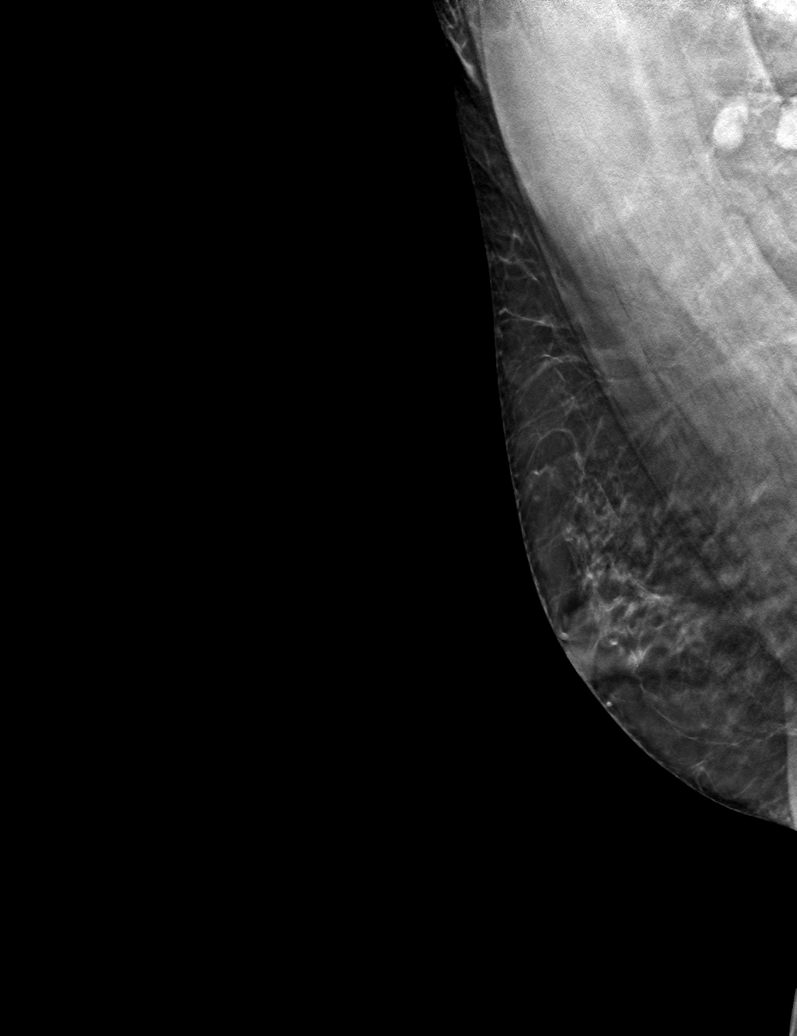

[R CC tomo (1 of 2) · tomo slice 29/56.0]
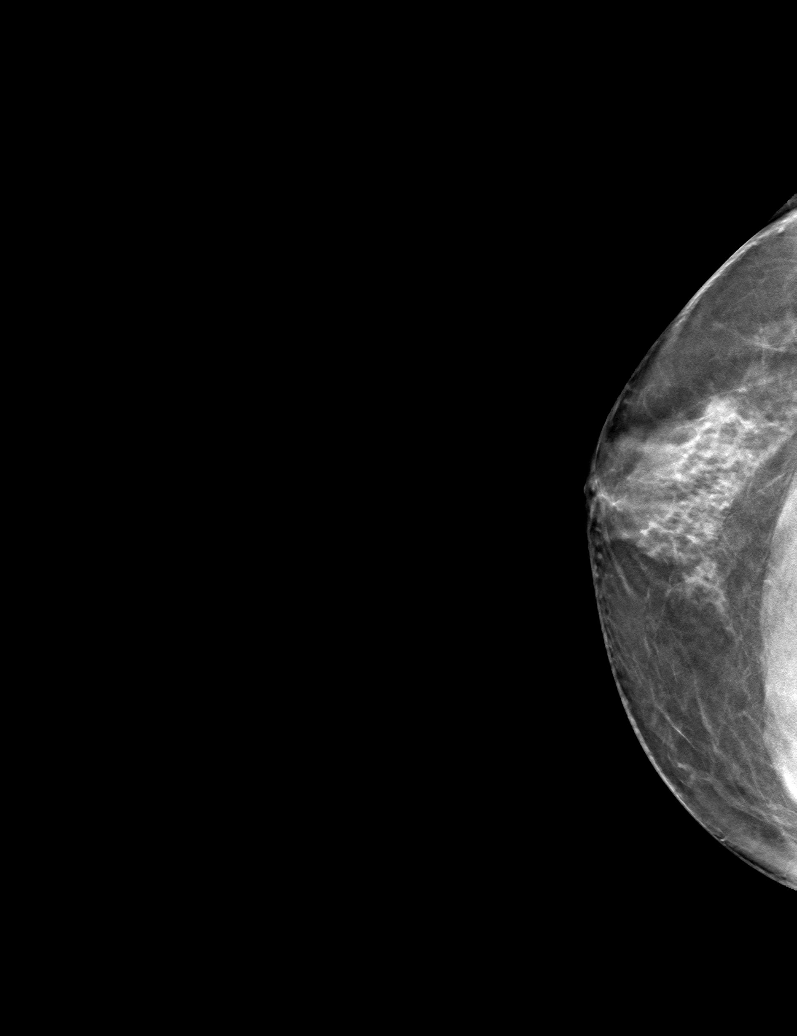

[R CC tomo (2 of 2) · tomo slice 27/53.0]
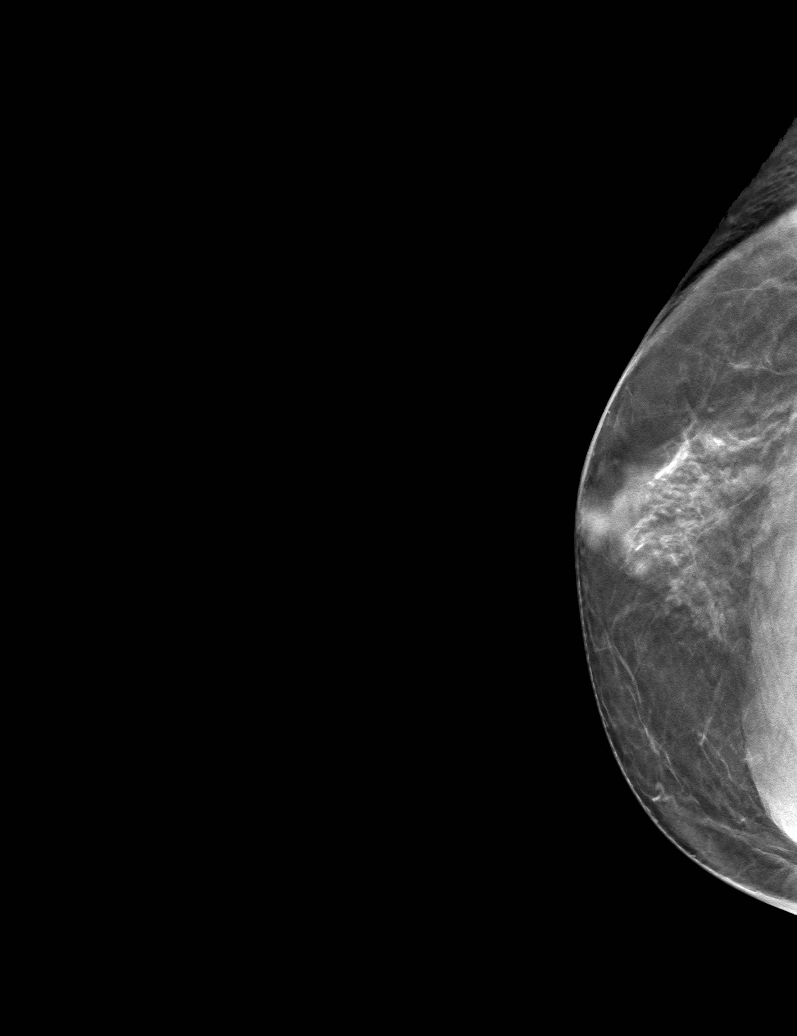

[6 of 18 positions shown; findings below may reference images not displayed]

ACR Breast Density Category b: There are scattered areas of
fibroglandular density.
FINDINGS: Note is again made of flame shaped fibroglandular tissue arising
from the right nipple and extending posteriorly. This is consistent
with moderate right-sided gynecomastia. Previously described
asymmetry in the outer right breast has improved/resolved. This area
has the appearance of fibroglandular tissue/gynecomastia.
IMPRESSION: Moderate right breast gynecomastia. No other suspicious or
persistent findings.

RECOMMENDATION:
I discussed with the patient the fact that gynecomastia can occur in
older men as testosterone levels decrease with age or in younger men
with low testosterone levels, causing a change in the serum
testosterone:estrogen ratio. We also discussed other potential
etiologies of gynecomastia including numerous prescription
medications and recreational drugs (marijuana and anabolic steroids
in particular). I counseled the patient to perform self-examination
to make sure that a hard lump does not form which could indicate
malignancy and would need further evaluation. We also discussed the
possibility of surgical excision if symptoms continue and if an
etiology of the gynecomastia cannot be determined and therefore
corrected.

I have discussed the findings and recommendations with the patient.
If applicable, a reminder letter will be sent to the patient
regarding the next appointment.

BI-RADS CATEGORY  2: Benign.

## 2021-09-19 ENCOUNTER — Encounter: Payer: Self-pay | Admitting: Internal Medicine

## 2021-10-09 NOTE — Progress Notes (Signed)
Complete physical exam  Patient: Robert Benitez   DOB: [birth year]   61 y.o. Male  MRN: [id number]  Subjective:    Chief Complaint  Patient presents with   Annual Exam   Robert Benitez is a 61 y.o. male who presents today for a complete physical exam. He reports consuming a general and lacto-ovo vegetarian  diet. The patient does not participate in regular exercise at present. He generally feels well. He reports sleeping well. He does not have additional problems to discuss today.    Most recent fall risk assessment:    10/22/2019    9:24 AM  [place] in the past year? 0  Number falls in past yr: 0     Most recent depression screenings:    10/10/2021   11:28 AM 10/22/2019    9:24 AM  PHQ 2/9 Scores  PHQ - 2 Score 0 0    Vision:Within last year, Dental: No current dental problems and Receives regular dental care, STD: The patient denies history of sexually transmitted disease., and PSA: Agrees to PSA testing    Patient Care Team: Samuel Bouche, NP as PCP - General (Nurse Practitioner)   Outpatient Medications Prior to Visit  Medication Sig   Ascorbic Acid (VITAMIN C) 500 MG CAPS Take by mouth.   Multiple Vitamin (THERA) TABS Take 1 tablet by mouth.   Facility-Administered Medications Prior to Visit  Medication Dose Route Frequency Provider   0.9 %  sodium chloride infusion  500 mL Intravenous Continuous Gatha Mayer, MD    ROS    Objective:     BP 104/79   Pulse 68   Ht '5\' 7"'$  (1.702 m)   Wt 182 lb (82.6 kg)   SpO2 98%   BMI 28.51 kg/m    Physical Exam   No results found for any visits on 10/10/21.     Assessment & Plan:    Routine Health Maintenance and Physical Exam  Immunization History  Administered Date(s) Administered   Influenza Split 03/17/2011   Influenza,inj,Quad PF,6+ Mos 02/26/2019   Influenza-Unspecified 11/08/2019   PFIZER(Purple Top)SARS-COV-2 Vaccination 09/07/2019, 10/09/2019   Tdap 03/17/2011   Zoster Recombinat  (Shingrix) 11/16/2017, 03/22/2018    Health Maintenance  Topic Date Due   COLONOSCOPY (Pts 45-68yr Insurance coverage will need to be confirmed)  09/19/2019   COVID-19 Vaccine (3 - Pfizer series) 12/04/2019   TETANUS/TDAP  03/16/2021   INFLUENZA VACCINE  10/18/2021   Hepatitis C Screening  Completed   HIV Screening  Completed   Zoster Vaccines- Shingrix  Completed   HPV VACCINES  Aged Out    Discussed health benefits of physical activity, and encouraged him to engage in regular exercise appropriate for his age and condition.  1. Annual physical exam Checking labs as below. UTD on preventative care. Wellness information provided with AVS.  - CBC with Differential/Platelet - COMPLETE METABOLIC PANEL WITH GFR - Lipid panel  2. Encounter to establish care Reviewed available information and discussed care concerns with patient.   3. Need for Tdap vaccination Tdap not available in office today. Advised patient that he can schedule a [place] for immunization once this is available in office again.   4. Prostate cancer screening Checking PSA.  - PSA  5. Thyroid disorder screen Checking TSH. - TSH  6. Diabetes mellitus screening Checking A1c.  - Hemoglobin A1c  Return in about 1 year (around 10/11/2022) for annual physical exam or sooner if needed.   JSamuel Bouche  NP

## 2021-10-10 ENCOUNTER — Encounter: Payer: Self-pay | Admitting: Medical-Surgical

## 2021-10-10 ENCOUNTER — Other Ambulatory Visit (HOSPITAL_COMMUNITY): Payer: Self-pay

## 2021-10-10 ENCOUNTER — Ambulatory Visit (AMBULATORY_SURGERY_CENTER): Payer: Self-pay | Admitting: *Deleted

## 2021-10-10 ENCOUNTER — Ambulatory Visit (INDEPENDENT_AMBULATORY_CARE_PROVIDER_SITE_OTHER): Payer: 59 | Admitting: Medical-Surgical

## 2021-10-10 VITALS — BP 104/79 | HR 68 | Ht 67.0 in | Wt 182.0 lb

## 2021-10-10 VITALS — Ht 66.0 in | Wt 183.4 lb

## 2021-10-10 DIAGNOSIS — Z7689 Persons encountering health services in other specified circumstances: Secondary | ICD-10-CM

## 2021-10-10 DIAGNOSIS — Z131 Encounter for screening for diabetes mellitus: Secondary | ICD-10-CM

## 2021-10-10 DIAGNOSIS — Z1211 Encounter for screening for malignant neoplasm of colon: Secondary | ICD-10-CM

## 2021-10-10 DIAGNOSIS — Z1329 Encounter for screening for other suspected endocrine disorder: Secondary | ICD-10-CM | POA: Diagnosis not present

## 2021-10-10 DIAGNOSIS — Z23 Encounter for immunization: Secondary | ICD-10-CM | POA: Diagnosis not present

## 2021-10-10 DIAGNOSIS — Z125 Encounter for screening for malignant neoplasm of prostate: Secondary | ICD-10-CM

## 2021-10-10 DIAGNOSIS — Z860100 Personal history of colon polyps, unspecified: Secondary | ICD-10-CM

## 2021-10-10 DIAGNOSIS — Z0000 Encounter for general adult medical examination without abnormal findings: Secondary | ICD-10-CM

## 2021-10-10 MED ORDER — BOOSTRIX 5-2.5-18.5 LF-MCG/0.5 IM SUSY
PREFILLED_SYRINGE | INTRAMUSCULAR | 0 refills | Status: DC
  Filled 2021-10-10: qty 0.5, 1d supply, fill #0

## 2021-10-10 MED ORDER — NA SULFATE-K SULFATE-MG SULF 17.5-3.13-1.6 GM/177ML PO SOLN
1.0000 | ORAL | 0 refills | Status: DC
  Filled 2021-10-10: qty 354, 1d supply, fill #0

## 2021-10-10 NOTE — Progress Notes (Signed)
Patient is here in-person for PV. Patient denies any allergies to eggs or soy. Patient denies any problems with anesthesia/sedation. Patient is not on any oxygen at home. Patient is not taking any diet/weight loss medications or blood thinners. Went over procedure prep instructions with the patient. Patient is aware of our care-partner policy.

## 2021-10-11 LAB — COMPLETE METABOLIC PANEL WITH GFR
AG Ratio: 1.6 (calc) (ref 1.0–2.5)
ALT: 19 U/L (ref 9–46)
AST: 14 U/L (ref 10–35)
Albumin: 4.2 g/dL (ref 3.6–5.1)
Alkaline phosphatase (APISO): 60 U/L (ref 35–144)
BUN: 13 mg/dL (ref 7–25)
CO2: 25 mmol/L (ref 20–32)
Calcium: 9.2 mg/dL (ref 8.6–10.3)
Chloride: 105 mmol/L (ref 98–110)
Creat: 1.15 mg/dL (ref 0.70–1.30)
Globulin: 2.7 g/dL (calc) (ref 1.9–3.7)
Glucose, Bld: 89 mg/dL (ref 65–99)
Potassium: 4.5 mmol/L (ref 3.5–5.3)
Sodium: 139 mmol/L (ref 135–146)
Total Bilirubin: 0.7 mg/dL (ref 0.2–1.2)
Total Protein: 6.9 g/dL (ref 6.1–8.1)
eGFR: 73 mL/min/{1.73_m2} (ref >=60)

## 2021-10-11 LAB — LIPID PANEL
Cholesterol: 183 mg/dL (ref <200)
HDL: 45 mg/dL (ref >=40)
LDL Cholesterol (Calc): 119 mg/dL (calc) — ABNORMAL HIGH
Non-HDL Cholesterol (Calc): 138 mg/dL (calc) — ABNORMAL HIGH (ref <130)
Total CHOL/HDL Ratio: 4.1 (calc) (ref <5.0)
Triglycerides: 90 mg/dL (ref <150)

## 2021-10-11 LAB — CBC WITH DIFFERENTIAL/PLATELET
Absolute Monocytes: 545 cells/uL (ref 200–950)
Basophils Absolute: 30 cells/uL (ref 0–200)
Basophils Relative: 0.6 %
Eosinophils Absolute: 110 cells/uL (ref 15–500)
Eosinophils Relative: 2.2 %
HCT: 47.2 % (ref 38.5–50.0)
Hemoglobin: 16.2 g/dL (ref 13.2–17.1)
Lymphs Abs: 2005 cells/uL (ref 850–3900)
MCH: 28.9 pg (ref 27.0–33.0)
MCHC: 34.3 g/dL (ref 32.0–36.0)
MCV: 84.1 fL (ref 80.0–100.0)
MPV: 10.2 fL (ref 7.5–12.5)
Monocytes Relative: 10.9 %
Neutro Abs: 2310 cells/uL (ref 1500–7800)
Neutrophils Relative %: 46.2 %
Platelets: 201 10*3/uL (ref 140–400)
RBC: 5.61 10*6/uL (ref 4.20–5.80)
RDW: 12.9 % (ref 11.0–15.0)
Total Lymphocyte: 40.1 %
WBC: 5 10*3/uL (ref 3.8–10.8)

## 2021-10-11 LAB — HEMOGLOBIN A1C
Hgb A1c MFr Bld: 5.6 % of total Hgb (ref <5.7)
Mean Plasma Glucose: 114 mg/dL
eAG (mmol/L): 6.3 mmol/L

## 2021-10-11 LAB — PSA: PSA: 1.03 ng/mL (ref <=4.00)

## 2021-10-11 LAB — TSH: TSH: 0.61 mIU/L (ref 0.40–4.50)

## 2021-10-19 ENCOUNTER — Encounter: Payer: Self-pay | Admitting: Internal Medicine

## 2021-10-24 ENCOUNTER — Ambulatory Visit (AMBULATORY_SURGERY_CENTER): Payer: 59 | Admitting: Internal Medicine

## 2021-10-24 ENCOUNTER — Encounter: Payer: Self-pay | Admitting: Internal Medicine

## 2021-10-24 VITALS — BP 110/68 | HR 75 | Temp 97.1°F | Resp 11 | Ht 66.0 in | Wt 183.0 lb

## 2021-10-24 DIAGNOSIS — D122 Benign neoplasm of ascending colon: Secondary | ICD-10-CM | POA: Diagnosis not present

## 2021-10-24 DIAGNOSIS — D120 Benign neoplasm of cecum: Secondary | ICD-10-CM | POA: Diagnosis not present

## 2021-10-24 DIAGNOSIS — Z86010 Personal history of colonic polyps: Secondary | ICD-10-CM | POA: Diagnosis not present

## 2021-10-24 DIAGNOSIS — Z09 Encounter for follow-up examination after completed treatment for conditions other than malignant neoplasm: Secondary | ICD-10-CM

## 2021-10-24 DIAGNOSIS — Z1211 Encounter for screening for malignant neoplasm of colon: Secondary | ICD-10-CM | POA: Diagnosis not present

## 2021-10-24 MED ORDER — SODIUM CHLORIDE 0.9 % IV SOLN: 500.0000 mL | Freq: Once | INTRAVENOUS | Status: DC

## 2021-10-24 NOTE — Progress Notes (Signed)
Pt's states no medical or surgical changes since previsit or office visit. 

## 2021-10-24 NOTE — Patient Instructions (Addendum)
I found and removed one small polyp that looks benign.  I will let you know pathology results and when to have another routine colonoscopy by mail and/or My Chart.  I appreciate the opportunity to care for you. Gatha Mayer, MD, FACG   YOU HAD AN ENDOSCOPIC PROCEDURE TODAY AT [facility]:   Refer to the procedure report that was given to you for any specific questions about what was found during the examination.  If the procedure report does not answer your questions, please call your gastroenterologist to clarify.  If you requested that your care partner not be given the details of your procedure findings, then the procedure report has been included in a sealed envelope for you to review at your convenience later.  YOU SHOULD EXPECT: Some feelings of bloating in the abdomen. Passage of more gas than usual.  Walking can help get rid of the air that was put into your GI tract during the procedure and reduce the bloating. If you had a lower endoscopy (such as a colonoscopy or flexible sigmoidoscopy) you may notice spotting of blood in your stool or on the toilet paper. If you underwent a bowel prep for your procedure, you may not have a normal bowel movement for a few days.  Please Note:  You might notice some irritation and congestion in your nose or some drainage.  This is from the oxygen used during your procedure.  There is no need for concern and it should clear up in a day or so.  SYMPTOMS TO REPORT IMMEDIATELY:  Following lower endoscopy (colonoscopy or flexible sigmoidoscopy):  Excessive amounts of blood in the stool  Significant tenderness or worsening of abdominal pains  Swelling of the abdomen that is new, acute  Fever of 100F or higher  For urgent or emergent issues, a gastroenterologist can be reached at any hour by calling [phone]. Do not use MyChart messaging for urgent concerns.    DIET:  We do recommend a small meal at first, but then you may  proceed to your regular diet.  Drink plenty of fluids but you should avoid alcoholic beverages for 24 hours.  ACTIVITY:  You should plan to take it easy for the rest of today and you should NOT DRIVE or use heavy machinery until tomorrow (because of the sedation medicines used during the test).    FOLLOW UP: Our staff will call the number listed on your records the next business day following your procedure.  We will call around 7:15- 8:00 am to check on you and address any questions or concerns that you may have regarding the information given to you following your procedure. If we do not reach you, we will leave a message.  If you develop any symptoms (ie: fever, flu-like symptoms, shortness of breath, cough etc.) before then, please call [phone].  If you test positive for Covid 19 in the 2 weeks post procedure, please call and report this information to [place].    If any biopsies were taken you will be contacted by phone or by letter within the next 1-3 weeks.  Please call us at [phone] if you have not heard about the biopsies in 3 weeks.    SIGNATURES/CONFIDENTIALITY: You and/or your care partner have signed paperwork which will be entered into your electronic medical record.  These signatures attest to the fact that that the information above on your After Visit Summary has been reviewed and is understood.  Full responsibility of  the confidentiality of this discharge information lies with you and/or your care-partner.

## 2021-10-24 NOTE — Op Note (Signed)
[place] Patient Name: Robert Benitez Procedure Date: 10/24/2021 11:25 AM MRN: [id number] Endoscopist: Gatha Mayer , MD Age: 61 Referring MD:  Date of Birth: [birth year] Gender: Male Account #: [id number] Procedure:                Colonoscopy Indications:              Surveillance: Personal history of adenomatous                            polyps on last colonoscopy 5 years ago Medicines:                Monitored Anesthesia Care Procedure:                Pre-Anesthesia Assessment:                           - Prior to the procedure, a History and Physical                            was performed, and patient medications and                            allergies were reviewed. The patient's tolerance of                            previous anesthesia was also reviewed. The risks                            and benefits of the procedure and the sedation                            options and risks were discussed with the patient.                            All questions were answered, and informed consent                            was obtained. Prior Anticoagulants: The patient has                            taken no previous anticoagulant or antiplatelet                            agents. ASA Grade Assessment: II - A patient with                            mild systemic disease. After reviewing the risks                            and benefits, the patient was deemed in                            satisfactory condition to undergo the procedure.  After obtaining informed consent, the colonoscope                            was passed under direct vision. Throughout the                            procedure, the patient's blood pressure, pulse, and                            oxygen saturations were monitored continuously. The                            Olympus CF-HQ190L (Serial# [id number]) Colonoscope was                            introduced through the anus  and advanced to the the                            cecum, identified by appendiceal orifice and                            ileocecal valve. The colonoscopy was performed                            without difficulty. The patient tolerated the                            procedure well. The quality of the bowel                            preparation was excellent. The ileocecal valve,                            appendiceal orifice, and rectum were photographed.                            The bowel preparation used was SUPREP via split                            dose instruction. Scope In: 11:44:18 AM Scope Out: 11:58:09 AM Scope Withdrawal Time: 0 hours 10 minutes 1 second  Total Procedure Duration: 0 hours 13 minutes 51 seconds  Findings:                 The perianal and digital rectal examinations were                            normal.                           A 5 mm polyp was found in the ascending colon. The                            polyp was semi-pedunculated. The polyp was removed  with a cold snare. Resection and retrieval were                            complete. Verification of patient identification                            for the specimen was done. Estimated blood loss was                            minimal.                           The exam was otherwise without abnormality on                            direct and retroflexion views. Complications:            No immediate complications. Estimated Blood Loss:     Estimated blood loss was minimal. Impression:               - One 5 mm polyp in the ascending colon, removed                            with a cold snare. Resected and retrieved.                           - The examination was otherwise normal on direct                            and retroflexion views.                           - Personal history of colonic polyp 10 mm adenoma                            removed 2018 Recommendation:            - Patient has a contact number available for                            emergencies. The signs and symptoms of potential                            delayed complications were discussed with the                            patient. Return to normal activities tomorrow.                            Written discharge instructions were provided to the                            patient.                           - Resume previous diet.                           -  Continue present medications.                           - Repeat colonoscopy is recommended for                            surveillance. The colonoscopy date will be                            determined after pathology results from today's                            exam become available for review. Gatha Mayer, MD 10/24/2021 12:01:54 PM This report has been signed electronically.

## 2021-10-24 NOTE — Progress Notes (Signed)
PT taken to PACU. Monitors in place. VSS. Report given to RN. 

## 2021-10-24 NOTE — Progress Notes (Signed)
[facility] History and Physical   Primary Care Physician:  Robert Bouche, NP   Reason for Procedure:   Hx colon adenoma  Plan:    colonoscopy     HPI: Robert Benitez is a 61 y.o. male here for surveillance colonoscopy exam. 10 mm adenoma 09/2016  Past Medical History:  Diagnosis Date   Anxiety    Depression    Hx of adenomatous polyp of colon 09/21/2016   Sciatica    Seasonal allergies     Past Surgical History:  Procedure Laterality Date   COLONOSCOPY  09/18/2016   Dr.Severus Benitez   None listed      Prior to Admission medications   Medication Sig Start Date End Date Taking? Authorizing Provider  Ascorbic Acid (VITAMIN C) 500 MG CAPS Take by mouth.   Yes [provider]  Multiple Vitamin (THERA) TABS Take 1 tablet by mouth.    [provider]  Tdap Robert Benitez) 5-2.5-18.5 LF-MCG/0.5 injection Inject into the muscle. 10/10/21   Robert Basques, MD  VITAMIN D PO Take by mouth.    [provider]    Current Outpatient Medications  Medication Sig Dispense Refill   Ascorbic Acid (VITAMIN C) 500 MG CAPS Take by mouth.     Multiple Vitamin (THERA) TABS Take 1 tablet by mouth.     Tdap (BOOSTRIX) 5-2.5-18.5 LF-MCG/0.5 injection Inject into the muscle. 0.5 mL 0   VITAMIN D PO Take by mouth.     Current Facility-Administered Medications  Medication Dose Route Frequency Provider Last Rate Last Admin   0.9 %  sodium chloride infusion  500 mL Intravenous Once Robert Mayer, MD        Allergies as of 10/24/2021   (No Known Allergies)    Family History  Problem Relation Age of Onset   Diabetes Father    Asthma Other    Diabetes Other    Colon cancer Neg Hx    Esophageal cancer Neg Hx    Rectal cancer Neg Hx    Stomach cancer Neg Hx    Colon polyps Neg Hx     Social History   Socioeconomic History   Marital status: Married    Spouse name: Not on file   Number of children: 3   Years of education: 12+   Highest education level: Not  on file  Occupational History   Occupation: Manufacturing engineer: [employer]  Tobacco Use   Smoking status: Never   Smokeless tobacco: Never  Vaping Use   Vaping Use: Never used  Substance and Sexual Activity   Alcohol use: No   Drug use: No   Sexual activity: Not on file  Other Topics Concern   Not on file  Social History Narrative   Patient is a [place] male married with 3 children. Patient wife Robert Benitez    Patient has a Master's degree.    Patient is a Social worker at [employer]       Social Determinants of Radio broadcast assistant Strain: Not on file  Food Insecurity: Not on file  Transportation Needs: Not on file  Physical Activity: Not on file  Stress: Not on file  Social Connections: Not on file  Intimate Partner Violence: Not on file    Review of Systems:  All other review of systems negative except as mentioned in the HPI.  Physical Exam: Vital signs BP 118/82   Pulse 84   Temp (!) 97.1 F (36.2 C)  Ht '5\' 6"'$  (1.676 m)   Wt 183 lb (83 kg)   SpO2 100%   BMI 29.54 kg/m   General:   Alert,  Well-developed, well-nourished, pleasant and cooperative in NAD Lungs:  Clear throughout to auscultation.   Heart:  Regular rate and rhythm; no murmurs, clicks, rubs,  or gallops. Abdomen:  Soft, nontender and nondistended. Normal bowel sounds.   Neuro/Psych:  Alert and cooperative. Normal mood and affect. A and O x 3   '@Robert Kerwin'$  Simonne Maffucci, MD, [facility] [phone] (pager) 10/24/2021 11:34 AM@

## 2021-10-24 NOTE — Progress Notes (Signed)
Called to room to assist during endoscopic procedure.  Patient ID and intended procedure confirmed with present staff. Received instructions for my participation in the procedure from the performing physician.  

## 2021-10-25 ENCOUNTER — Telehealth: Payer: Self-pay | Admitting: *Deleted

## 2021-10-25 NOTE — Telephone Encounter (Signed)
  Follow up Call-     10/24/2021   10:31 AM  Call back number  Post procedure Call Back phone  # [phone]  Permission to leave phone message Yes     Patient questions:  Do you have a fever, pain , or abdominal swelling? No. Pain Score  0 *  Have you tolerated food without any problems? Yes.    Have you been able to return to your normal activities? Yes.    Do you have any questions about your discharge instructions: Diet   No. Medications  No. Follow up visit  No.  Do you have questions or concerns about your Care? No.  Actions: * If pain score is 4 or above: No action needed, pain <4.

## 2021-10-27 ENCOUNTER — Encounter: Payer: Self-pay | Admitting: Internal Medicine

## 2022-10-12 ENCOUNTER — Ambulatory Visit: Payer: 59 | Admitting: Medical-Surgical

## 2022-10-17 ENCOUNTER — Ambulatory Visit (INDEPENDENT_AMBULATORY_CARE_PROVIDER_SITE_OTHER): Payer: Commercial Managed Care - PPO | Admitting: Medical-Surgical

## 2022-10-17 ENCOUNTER — Encounter: Payer: Self-pay | Admitting: Medical-Surgical

## 2022-10-17 VITALS — BP 125/70 | HR 84 | Ht 66.0 in | Wt 178.2 lb

## 2022-10-17 DIAGNOSIS — E7800 Pure hypercholesterolemia, unspecified: Secondary | ICD-10-CM | POA: Diagnosis not present

## 2022-10-17 DIAGNOSIS — Z0000 Encounter for general adult medical examination without abnormal findings: Secondary | ICD-10-CM | POA: Diagnosis not present

## 2022-10-17 DIAGNOSIS — Z125 Encounter for screening for malignant neoplasm of prostate: Secondary | ICD-10-CM | POA: Diagnosis not present

## 2022-10-17 NOTE — Progress Notes (Signed)
Complete physical exam  Patient: Robert Benitez   DOB: 07/30/1960   62 y.o. Male  MRN: [id number]  Subjective:    Chief Complaint  Patient presents with   Annual Exam   Robert Benitez is a 62 y.o. male who presents today for a complete physical exam. He reports consuming a  mostly lacto-ovo vegetarian  diet.  Intermittent exercise depending on schedule.   He generally feels well. He reports sleeping well. He does not have additional problems to discuss today.    Most recent fall risk assessment:    10/17/2022    9:55 AM  Fall Risk   Falls in the past year? 0  Number falls in past yr: 0  Injury with Fall? 0  Risk for fall due to : No Fall Risks  Follow up Falls evaluation completed     Most recent depression screenings:    10/17/2022    9:56 AM 10/10/2021   11:28 AM  PHQ 2/9 Scores  PHQ - 2 Score 0 0    Vision:Within last year, Dental: No current dental problems and Receives regular dental care, and STD: The patient denies history of sexually transmitted disease.    Patient Care Team: Christen Butter, NP as PCP - General (Nurse Practitioner)   Outpatient Medications Prior to Visit  Medication Sig   Ascorbic Acid (VITAMIN C) 500 MG CAPS Take by mouth.   Multiple Vitamin (THERA) TABS Take 1 tablet by mouth.   VITAMIN D PO Take by mouth.   Tdap (BOOSTRIX) 5-2.5-18.5 LF-MCG/0.5 injection Inject into the muscle. (Patient not taking: Reported on 10/17/2022)   No facility-administered medications prior to visit.   Review of Systems  Constitutional:  Negative for chills, fever, malaise/fatigue and weight loss.  HENT:  Negative for congestion, ear pain, hearing loss, sinus pain and sore throat.   Eyes:  Negative for blurred vision, photophobia and pain.  Respiratory:  Negative for cough, shortness of breath and wheezing.        Snoring   Cardiovascular:  Negative for chest pain, palpitations and leg swelling.  Gastrointestinal:  Negative for abdominal pain, constipation,  diarrhea, heartburn, nausea and vomiting.  Genitourinary:  Negative for dysuria, frequency and urgency.  Musculoskeletal:  Negative for falls and neck pain.  Skin:  Negative for itching and rash.  Neurological:  Negative for dizziness, weakness and headaches.  Endo/Heme/Allergies:  Negative for polydipsia. Does not bruise/bleed easily.  Psychiatric/Behavioral:  Negative for depression, substance abuse and suicidal ideas. The patient is not nervous/anxious and does not have insomnia.      Objective:     BP 125/70   Pulse 84   Ht 5\' 6"  (1.676 m)   Wt 178 lb 4 oz (80.9 kg)   SpO2 97%   BMI 28.77 kg/m    Physical Exam Vitals reviewed.  Constitutional:      General: He is not in acute distress.    Appearance: Normal appearance. He is not ill-appearing.  HENT:     Head: Normocephalic and atraumatic.     Right Ear: Tympanic membrane, ear canal and external ear normal. There is no impacted cerumen.     Left Ear: Tympanic membrane, ear canal and external ear normal. There is no impacted cerumen.     Nose: Nose normal. No congestion or rhinorrhea.     Mouth/Throat:     Mouth: Mucous membranes are moist.     Pharynx: No oropharyngeal exudate or posterior oropharyngeal erythema.  Eyes:     General:  No scleral icterus.       Right eye: No discharge.        Left eye: No discharge.     Extraocular Movements: Extraocular movements intact.     Conjunctiva/sclera: Conjunctivae normal.     Pupils: Pupils are equal, round, and reactive to light.  Neck:     Thyroid: No thyromegaly.     Vascular: No carotid bruit or JVD.     Trachea: Trachea normal.  Cardiovascular:     Rate and Rhythm: Normal rate and regular rhythm.     Pulses: Normal pulses.     Heart sounds: Normal heart sounds. No murmur heard.    No friction rub. No gallop.  Pulmonary:     Effort: Pulmonary effort is normal. No respiratory distress.     Breath sounds: Normal breath sounds. No wheezing.  Abdominal:     General:  Bowel sounds are normal. There is no distension.     Palpations: Abdomen is soft.     Tenderness: There is no abdominal tenderness. There is no guarding.  Musculoskeletal:        General: Normal range of motion.     Cervical back: Normal range of motion and neck supple.  Lymphadenopathy:     Cervical: No cervical adenopathy.  Skin:    General: Skin is warm and dry.  Neurological:     Mental Status: He is alert and oriented to person, place, and time.     Cranial Nerves: No cranial nerve deficit.  Psychiatric:        Mood and Affect: Mood normal.        Behavior: Behavior normal.        Thought Content: Thought content normal.        Judgment: Judgment normal.      No results found for any visits on 10/17/22.     Assessment & Plan:    Routine Health Maintenance and Physical Exam  Immunization History  Administered Date(s) Administered   Influenza Split 03/17/2011   Influenza,inj,Quad PF,6+ Mos 02/26/2019   Influenza-Unspecified 11/08/2019   PFIZER(Purple Top)SARS-COV-2 Vaccination 09/07/2019, 10/09/2019   Tdap 03/17/2011, 10/10/2021   Zoster Recombinant(Shingrix) 11/16/2017, 03/22/2018    Health Maintenance  Topic Date Due   COVID-19 Vaccine (3 - 2023-24 season) 11/02/2022 (Originally 11/18/2021)   INFLUENZA VACCINE  10/19/2022   Colonoscopy  10/25/2026   DTaP/Tdap/Td (3 - Td or Tdap) 10/11/2031   Hepatitis C Screening  Completed   HIV Screening  Completed   Zoster Vaccines- Shingrix  Completed   HPV VACCINES  Aged Out    Discussed health benefits of physical activity, and encouraged him to engage in regular exercise appropriate for his age and condition.  1. Annual physical exam Checking labs as below.  Up-to-date on preventative care.  Wellness information provided with AVS. - CBC with Differential/Platelet - Comprehensive metabolic panel - Lipid panel  2. Elevated LDL cholesterol level Checking lipid panel today.  3. Prostate cancer screening Reviewed  recommendations for PSA monitoring.  This has been checked in 2021 and and again in 2023.  He would like to have it rechecked this year to establish a trend.  Ordering today. - PSA Total (Reflex To Free)  Return in about 1 year (around 10/17/2023) for annual physical exam or sooner if needed.   Christen Butter, NP

## 2022-10-19 ENCOUNTER — Encounter: Payer: Self-pay | Admitting: Medical-Surgical

## 2023-07-20 ENCOUNTER — Encounter: Payer: Self-pay | Admitting: Medical-Surgical

## 2023-07-20 ENCOUNTER — Ambulatory Visit

## 2023-07-20 ENCOUNTER — Ambulatory Visit: Admitting: Medical-Surgical

## 2023-07-20 ENCOUNTER — Ambulatory Visit (INDEPENDENT_AMBULATORY_CARE_PROVIDER_SITE_OTHER)

## 2023-07-20 VITALS — BP 104/62 | HR 79 | Resp 20 | Ht 66.0 in | Wt 174.1 lb

## 2023-07-20 DIAGNOSIS — M79645 Pain in left finger(s): Secondary | ICD-10-CM

## 2023-07-20 DIAGNOSIS — M7989 Other specified soft tissue disorders: Secondary | ICD-10-CM | POA: Diagnosis not present

## 2023-07-20 DIAGNOSIS — R209 Unspecified disturbances of skin sensation: Secondary | ICD-10-CM

## 2023-07-20 DIAGNOSIS — R208 Other disturbances of skin sensation: Secondary | ICD-10-CM | POA: Diagnosis not present

## 2023-07-20 MED ORDER — IBUPROFEN 800 MG PO TABS: 800.0000 mg | ORAL_TABLET | Freq: Three times a day (TID) | ORAL | 2 refills | Status: AC | PRN

## 2023-07-20 NOTE — Progress Notes (Signed)
        Established patient visit  History, exam, impression, and plan:  1. Finger pain, left (Primary) Pleasant 63 year old male presenting today with reports of left fifth finger pain that started yesterday. No recent injuries or new activities. Pain is described as 7/10 with movement, dull and aching. Flexion of the DIP and PIP joints limited. No redness but positive for swelling from the MCP joint to the PIP joint. Tenderness with pinching of the soft tissues but not the bone. Some tenderness to palpation of the PIP joint and the MCP joint. Had the same symptoms in February but was on the distal fourth finger and resolved spontaneously after 6 weeks. Has not tried anything at home for pain. Capillary refill brisk with 2+ radial pulses. Plan for x-ray. Will check some labs for further evaluation. Recommend buddy taping for comfort, coban provided. Ok to use Ibuprofen 800mg  every 8 hours as needed.  - DG Hand Complete Left; Future - CBC - CMP14+EGFR - Uric acid - Sed Rate (ESR) - C-reactive protein  2. Cold feet Has had issues with his low back and sciatica affecting the right side for many years. Now reports that the sciatica has been doing much better with only occasional flares. Has had US , NCV, and imaging in the past but no one was able to determine a cause for his additional leg symptoms over the years. For the last few years, he has had difficulty with his right foot feeling very cold. He is not bothered by it at night when sleeping but the moment he takes the blanket off his foot in the morning, his foot begins to feel uncomfortably cold. There has been no skin color changes. No swelling of the foot or leg. Pulses are good and skin is warm. He has to put a sock on immediately and wear it all day to keep the symptoms in check. Worried that it is getting worse and is interested in further investigation. Plan for US  of the right lower extremity to evaluate blood flow. Getting  ABIs. Checking  labs below.  - CBC - CMP14+EGFR - Lipid panel - TSH - VAS US  ABI WITH/WO TBI; Future - US  Venous Img Lower Unilateral Right; Future  Procedures performed this visit: None.  Return if symptoms worsen or fail to improve.  __________________________________ Maryl Snook, DNP, APRN, FNP-BC Primary Care and Sports Medicine [facility]

## 2023-07-21 LAB — CBC
Hematocrit: 46.1 % (ref 37.5–51.0)
Hemoglobin: 15.6 g/dL (ref 13.0–17.7)
MCH: 29.1 pg (ref 26.6–33.0)
MCHC: 33.8 g/dL (ref 31.5–35.7)
MCV: 86 fL (ref 79–97)
Platelets: 204 10*3/uL (ref 150–450)
RBC: 5.36 x10E6/uL (ref 4.14–5.80)
RDW: 12.6 % (ref 11.6–15.4)
WBC: 4.6 10*3/uL (ref 3.4–10.8)

## 2023-07-21 LAB — LIPID PANEL
Chol/HDL Ratio: 4.6 ratio (ref 0.0–5.0)
Cholesterol, Total: 181 mg/dL (ref 100–199)
HDL: 39 mg/dL — ABNORMAL LOW (ref >39)
LDL Chol Calc (NIH): 122 mg/dL — ABNORMAL HIGH (ref 0–99)
Triglycerides: 108 mg/dL (ref 0–149)
VLDL Cholesterol Cal: 20 mg/dL (ref 5–40)

## 2023-07-21 LAB — CMP14+EGFR
ALT: 19 IU/L (ref 0–44)
AST: 16 IU/L (ref 0–40)
Albumin: 4 g/dL (ref 3.9–4.9)
Alkaline Phosphatase: 69 IU/L (ref 44–121)
BUN/Creatinine Ratio: 12 (ref 10–24)
BUN: 13 mg/dL (ref 8–27)
Bilirubin Total: 0.4 mg/dL (ref 0.0–1.2)
CO2: 24 mmol/L (ref 20–29)
Calcium: 9.2 mg/dL (ref 8.6–10.2)
Chloride: 102 mmol/L (ref 96–106)
Creatinine, Ser: 1.1 mg/dL (ref 0.76–1.27)
Globulin, Total: 2.3 g/dL (ref 1.5–4.5)
Glucose: 126 mg/dL — ABNORMAL HIGH (ref 70–99)
Potassium: 4.2 mmol/L (ref 3.5–5.2)
Sodium: 138 mmol/L (ref 134–144)
Total Protein: 6.3 g/dL (ref 6.0–8.5)
eGFR: 76 mL/min/{1.73_m2} (ref >59)

## 2023-07-21 LAB — URIC ACID: Uric Acid: 6.2 mg/dL (ref 3.8–8.4)

## 2023-07-21 LAB — C-REACTIVE PROTEIN: CRP: 1 mg/L (ref 0–10)

## 2023-07-21 LAB — SEDIMENTATION RATE: Sed Rate: 6 mm/h (ref 0–30)

## 2023-07-21 LAB — TSH: TSH: 1.25 u[IU]/mL (ref 0.450–4.500)

## 2023-07-23 ENCOUNTER — Encounter: Payer: Self-pay | Admitting: Medical-Surgical

## 2023-08-03 ENCOUNTER — Ambulatory Visit: Admitting: Physician Assistant

## 2023-08-03 ENCOUNTER — Ambulatory Visit: Admitting: Medical-Surgical

## 2023-08-03 ENCOUNTER — Ambulatory Visit: Payer: Self-pay | Admitting: *Deleted

## 2023-08-03 ENCOUNTER — Encounter: Payer: Self-pay | Admitting: Physician Assistant

## 2023-08-03 VITALS — BP 123/76 | HR 97 | Temp 101.9°F | Ht 66.0 in | Wt 178.0 lb

## 2023-08-03 DIAGNOSIS — R6889 Other general symptoms and signs: Secondary | ICD-10-CM | POA: Diagnosis not present

## 2023-08-03 DIAGNOSIS — J189 Pneumonia, unspecified organism: Secondary | ICD-10-CM

## 2023-08-03 LAB — POC COVID19 BINAXNOW: SARS Coronavirus 2 Ag: NEGATIVE

## 2023-08-03 LAB — POCT INFLUENZA A/B
Influenza A, POC: NEGATIVE
Influenza B, POC: NEGATIVE

## 2023-08-03 LAB — POCT RAPID STREP A (OFFICE): Rapid Strep A Screen: NEGATIVE

## 2023-08-03 MED ORDER — PREDNISONE 20 MG PO TABS: ORAL_TABLET | ORAL | 0 refills | Status: DC

## 2023-08-03 MED ORDER — AZITHROMYCIN 250 MG PO TABS: ORAL_TABLET | ORAL | 0 refills | Status: DC

## 2023-08-03 MED ORDER — ALBUTEROL SULFATE HFA 108 (90 BASE) MCG/ACT IN AERS
2.0000 | INHALATION_SPRAY | Freq: Four times a day (QID) | RESPIRATORY_TRACT | 0 refills | Status: AC | PRN
Start: 2023-08-03 — End: ?

## 2023-08-03 NOTE — Progress Notes (Signed)
 Acute Office Visit  Subjective:     Patient ID: Robert Benitez, male    DOB: [birth year], 63 y.o.   MRN: [id number]  Chief Complaint  Patient presents with   Cough    HPI Patient is in today for 1 week of cough, congestion, chest tightness, sinus pressure. He feels like symptoms are worsening a little with more chest tightness. His whole family has been sick but his symptoms have been the worst. He has hx of needing inhalers when he gets sick. He is taking dayquil and nyquil. Cough is productive with yellow to clear drainage.   ROS     See HPI Objective:    BP 123/76   Pulse 97   Temp (!) 101.9 F (38.8 C) (Oral)   Ht 5\' 6"  (1.676 m)   Wt 178 lb (80.7 kg)   SpO2 97%   BMI 28.73 kg/m  BP Readings from Last 3 Encounters:  08/03/23 123/76  07/20/23 104/62  10/17/22 125/70   Wt Readings from Last 3 Encounters:  08/03/23 178 lb (80.7 kg)  07/20/23 174 lb 1.9 oz (79 kg)  10/17/22 178 lb 4 oz (80.9 kg)      Physical Exam Constitutional:      Appearance: Normal appearance. He is ill-appearing.  HENT:     Head: Normocephalic.     Right Ear: Tympanic membrane, ear canal and external ear normal. There is no impacted cerumen.     Left Ear: Tympanic membrane, ear canal and external ear normal. There is no impacted cerumen.     Nose: Rhinorrhea present.     Mouth/Throat:     Mouth: Mucous membranes are moist.     Pharynx: Posterior oropharyngeal erythema present. No oropharyngeal exudate.  Eyes:     Conjunctiva/sclera: Conjunctivae normal.  Cardiovascular:     Rate and Rhythm: Normal rate and regular rhythm.  Pulmonary:     Effort: Pulmonary effort is normal.     Breath sounds: Wheezing and rhonchi present.     Comments: Wheezing and rhonchi of left lung Neurological:     General: No focal deficit present.     Mental Status: He is alert and oriented to person, place, and time.  Psychiatric:        Mood and Affect: Mood normal.     Results for orders placed or  performed in visit on 08/03/23  POCT Influenza A/B  Result Value Ref Range   Influenza A, POC Negative Negative   Influenza B, POC Negative Negative  POC COVID-19  Result Value Ref Range   SARS Coronavirus 2 Ag Negative Negative  POCT rapid strep A  Result Value Ref Range   Rapid Strep A Screen Negative Negative        Assessment & Plan:  Robert Benitez was seen today for cough.  Diagnoses and all orders for this visit:  Pneumonia of left lower lobe due to infectious organism -     albuterol (VENTOLIN HFA) 108 (90 Base) MCG/ACT inhaler; Inhale 2 puffs into the lungs every 6 (six) hours as needed. -     azithromycin (ZITHROMAX Z-PAK) 250 MG tablet; Take 2 tablets (500 mg) on  Day 1,  followed by 1 tablet (250 mg) once daily on Days 2 through 5. -     predniSONE (DELTASONE) 20 MG tablet; Take one tablet twice a day with meals for 5 days.  Flu-like symptoms -     POCT Influenza A/B -     POC COVID-19 -  POCT rapid strep A  Vitals reassuring Temperature in office today Left lung with wheezing and rhonchi in lower lobe Flu/covid/strep negative Start zpak, prednisone, albuterol inhaler for pneumonia Rest and hydrate Delsym for cough Follow up as needed if symptoms persist or worsen  Sandy Crumb, PA-C

## 2023-08-03 NOTE — Patient Instructions (Signed)

## 2023-08-03 NOTE — Telephone Encounter (Signed)
  Chief Complaint: cough- chest pain,SOB from constant cough Symptoms: heavy mucus, unable to sleep, chest pain and SOB with laying down Frequency: over 1 week Pertinent Negatives: Patient denies fever Disposition: [] ED /[] Urgent Care (no appt availability in office) / [x] Appointment(In office/virtual)/ []  [facility]/ [] Home Care/ [] Refused Recommended Disposition /[] [place] [facility]/ []  Follow-up with PCP   Copied from CRM [id number]. Topic: Clinical - Red Word Triage >> Aug 03, 2023  8:07 AM Lenon Radar A wrote: Red Word that prompted transfer to Nurse Triage: Difficulty Breathing Reason for Disposition  [1] MILD difficulty breathing (e.g., minimal/no SOB at rest, SOB with walking, pulse <100) AND [2] NEW-onset or WORSE than normal  Answer Assessment - Initial Assessment Questions 1. RESPIRATORY STATUS: "Describe your breathing?" (e.g., wheezing, shortness of breath, unable to speak, severe coughing)      SOB- with heavy mucus- chest pain 2. ONSET: "When did this breathing problem begin?"      Over 1 week- started with sinus symptoms 3. PATTERN "Does the difficult breathing come and go, or has it been constant since it started?"      Constant- better with standing 4. SEVERITY: "How bad is your breathing?" (e.g., mild, moderate, severe)    - MILD: No SOB at rest, mild SOB with walking, speaks normally in sentences, can lie down, no retractions, pulse < 100.    - MODERATE: SOB at rest, SOB with minimal exertion and prefers to sit, cannot lie down flat, speaks in phrases, mild retractions, audible wheezing, pulse 100-120.    - SEVERE: Very SOB at rest, speaks in single words, struggling to breathe, sitting hunched forward, retractions, pulse > 120      Laying down effects breathing- mild 5. RECURRENT SYMPTOM: "Have you had difficulty breathing before?" If Yes, ask: "When was the last time?" and "What happened that time?"      no 6. CARDIAC HISTORY: "Do you have any history of  heart disease?" (e.g., heart attack, angina, bypass surgery, angioplasty)      no 7. LUNG HISTORY: "Do you have any history of lung disease?"  (e.g., pulmonary embolus, asthma, emphysema)     Hx asthma 8. CAUSE: "What do you think is causing the breathing problem?"      Sinus symptoms 9. OTHER SYMPTOMS: "Do you have any other symptoms? (e.g., dizziness, runny nose, cough, chest pain, fever)     Congestion, cough  Protocols used: Breathing Difficulty-A-AH

## 2023-08-14 ENCOUNTER — Ambulatory Visit (HOSPITAL_COMMUNITY)
Admission: RE | Admit: 2023-08-14 | Discharge: 2023-08-14 | Disposition: A | Discharge status: Home / Self Care | Source: Ambulatory Visit | Attending: Medical-Surgical | Admitting: Medical-Surgical

## 2023-08-14 DIAGNOSIS — R209 Unspecified disturbances of skin sensation: Secondary | ICD-10-CM | POA: Diagnosis not present

## 2023-08-15 LAB — VAS US ABI WITH/WO TBI
Left ABI: 1.22
Right ABI: 1.16

## 2023-08-16 ENCOUNTER — Ambulatory Visit: Payer: Self-pay | Admitting: Medical-Surgical

## 2023-10-18 ENCOUNTER — Encounter: Payer: Self-pay | Admitting: Medical-Surgical

## 2023-10-18 ENCOUNTER — Encounter: Payer: Commercial Managed Care - PPO | Admitting: Medical-Surgical

## 2023-10-18 ENCOUNTER — Ambulatory Visit (INDEPENDENT_AMBULATORY_CARE_PROVIDER_SITE_OTHER): Admitting: Medical-Surgical

## 2023-10-18 VITALS — BP 98/61 | HR 64 | Resp 20 | Ht 66.0 in | Wt 173.3 lb

## 2023-10-18 DIAGNOSIS — Z125 Encounter for screening for malignant neoplasm of prostate: Secondary | ICD-10-CM | POA: Diagnosis not present

## 2023-10-18 DIAGNOSIS — Z0000 Encounter for general adult medical examination without abnormal findings: Secondary | ICD-10-CM | POA: Diagnosis not present

## 2023-10-18 NOTE — Patient Instructions (Signed)

## 2023-10-18 NOTE — Progress Notes (Signed)
 Complete physical exam  Patient: Robert Benitez   DOB: 1960-11-09   63 y.o. Male  MRN: [id number]  Subjective:    Chief Complaint  Patient presents with   Annual Exam    Robert Benitez is a 63 y.o. male who presents today for a complete physical exam. He reports consuming a ovolactovegetarian diet. Exercising regularly but slacked a little when they had to go out of the country.  He generally feels well. He reports sleeping well. He does not have additional problems to discuss today.    Most recent fall risk assessment:    10/17/2022    9:55 AM  Fall Risk   Falls in the past year? 0  Number falls in past yr: 0  Injury with Fall? 0  Risk for fall due to : No Fall Risks  Follow up Falls evaluation completed     Most recent depression screenings:    10/17/2022    9:56 AM 10/10/2021   11:28 AM  PHQ 2/9 Scores  PHQ - 2 Score 0 0    Vision:Within last year, Dental: No current dental problems and Receives regular dental care, and PSA: Agrees to PSA testing    Patient Care Team: Robert Mini, NP as PCP - General (Nurse Practitioner)   Outpatient Medications Prior to Visit  Medication Sig   albuterol  (VENTOLIN  HFA) 108 (90 Base) MCG/ACT inhaler Inhale 2 puffs into the lungs every 6 (six) hours as needed.   Ascorbic Acid (VITAMIN C) 500 MG CAPS Take by mouth.   ibuprofen  (ADVIL ) 800 MG tablet Take 1 tablet (800 mg total) by mouth every 8 (eight) hours as needed.   Multiple Vitamin (THERA) TABS Take 1 tablet by mouth.   VITAMIN D  PO Take by mouth.   [DISCONTINUED] azithromycin  (ZITHROMAX  Z-PAK) 250 MG tablet Take 2 tablets (500 mg) on  Day 1,  followed by 1 tablet (250 mg) once daily on Days 2 through 5.   [DISCONTINUED] predniSONE  (DELTASONE ) 20 MG tablet Take one tablet twice a day with meals for 5 days.   No facility-administered medications prior to visit.   Review of Systems  Constitutional:  Negative for chills, fever, malaise/fatigue and weight loss.  HENT:  Negative  for congestion, ear pain, hearing loss, sinus pain and sore throat.   Eyes:  Negative for blurred vision, photophobia and pain.  Respiratory:  Negative for cough, shortness of breath and wheezing.   Cardiovascular:  Negative for chest pain, palpitations and leg swelling.  Gastrointestinal:  Negative for abdominal pain, constipation, diarrhea, heartburn, nausea and vomiting.  Genitourinary:  Negative for dysuria, frequency and urgency.  Musculoskeletal:  Negative for falls and neck pain.  Skin:  Negative for itching and rash.  Neurological:  Negative for dizziness, weakness and headaches.  Endo/Heme/Allergies:  Negative for polydipsia. Does not bruise/bleed easily.  Psychiatric/Behavioral:  Negative for depression, substance abuse and suicidal ideas. The patient is not nervous/anxious.      Objective:    BP 98/61 (BP Location: Right Arm, Cuff Size: Normal)   Pulse 64   Resp 20   Ht 5' 6 (1.676 m)   Wt 173 lb 4.8 oz (78.6 kg)   SpO2 97%   BMI 27.97 kg/m    Physical Exam Vitals reviewed.  Constitutional:      General: He is not in acute distress.    Appearance: Normal appearance. He is not ill-appearing.  HENT:     Head: Normocephalic and atraumatic.     Right Ear: External  ear normal. There is impacted cerumen.     Left Ear: Tympanic membrane, ear canal and external ear normal. There is no impacted cerumen.     Nose: Nose normal. No congestion or rhinorrhea.     Mouth/Throat:     Mouth: Mucous membranes are moist.     Pharynx: No oropharyngeal exudate or posterior oropharyngeal erythema.  Eyes:     General: No scleral icterus.       Right eye: No discharge.        Left eye: No discharge.     Extraocular Movements: Extraocular movements intact.     Conjunctiva/sclera: Conjunctivae normal.     Pupils: Pupils are equal, round, and reactive to light.  Neck:     Thyroid : No thyromegaly.     Vascular: No carotid bruit or JVD.     Trachea: Trachea normal.  Cardiovascular:      Rate and Rhythm: Normal rate and regular rhythm.     Pulses: Normal pulses.     Heart sounds: Normal heart sounds. No murmur heard.    No friction rub. No gallop.  Pulmonary:     Effort: Pulmonary effort is normal. No respiratory distress.     Breath sounds: Normal breath sounds. No wheezing.  Abdominal:     General: Bowel sounds are normal. There is no distension.     Palpations: Abdomen is soft.     Tenderness: There is no abdominal tenderness. There is no guarding.  Musculoskeletal:        General: Normal range of motion.     Cervical back: Normal range of motion and neck supple.  Lymphadenopathy:     Cervical: No cervical adenopathy.  Skin:    General: Skin is warm and dry.  Neurological:     Mental Status: He is alert and oriented to person, place, and time.     Cranial Nerves: No cranial nerve deficit.  Psychiatric:        Mood and Affect: Mood normal.        Behavior: Behavior normal.        Thought Content: Thought content normal.        Judgment: Judgment normal.   No results found for any visits on 10/18/23.     Assessment & Plan:    Routine Health Maintenance and Physical Exam  Immunization History  Administered Date(s) Administered   Influenza Split 03/17/2011   Influenza,inj,Quad PF,6+ Mos 02/26/2019   Influenza-Unspecified 11/08/2019   PFIZER(Purple Top)SARS-COV-2 Vaccination 09/07/2019, 10/09/2019   Tdap 03/17/2011, 10/10/2021   Zoster Recombinant(Shingrix ) 11/16/2017, 03/22/2018    Health Maintenance  Topic Date Due   COVID-19 Vaccine (3 - 2024-25 season) 11/03/2023 (Originally 11/19/2022)   INFLUENZA VACCINE  10/19/2023   Colonoscopy  10/25/2026   DTaP/Tdap/Td (3 - Td or Tdap) 10/11/2031   Hepatitis C Screening  Completed   HIV Screening  Completed   Zoster Vaccines- Shingrix   Completed   Hepatitis B Vaccines  Aged Out   HPV VACCINES  Aged Out   Meningococcal B Vaccine  Aged Out    Discussed health benefits of physical activity, and encouraged  him to engage in regular exercise appropriate for his age and condition.  1. Annual physical exam (Primary) Deferring additional labs today as they were just checked in May. UTD on preventative care. Wellness information provided with AVS.   2. Prostate cancer screening Discussed risks/benefits of screening. Patient agreeable to testing. PSA ordered.  - PSA Total (Reflex To Free)  Return in about  1 year (around 10/17/2024) for annual physical exam or sooner if needed.     Reya Aurich, NP

## 2023-10-19 ENCOUNTER — Ambulatory Visit: Payer: Self-pay | Admitting: Medical-Surgical

## 2023-10-19 LAB — PSA TOTAL (REFLEX TO FREE): Prostate Specific Ag, Serum: 1.1 ng/mL (ref 0.0–4.0)

## 2024-01-15 ENCOUNTER — Encounter: Payer: Self-pay | Admitting: Medical-Surgical

## 2024-01-15 ENCOUNTER — Ambulatory Visit

## 2024-01-15 ENCOUNTER — Ambulatory Visit: Admitting: Medical-Surgical

## 2024-01-15 VITALS — BP 115/69 | HR 77 | Resp 20 | Ht 66.0 in | Wt 179.0 lb

## 2024-01-15 DIAGNOSIS — J4530 Mild persistent asthma, uncomplicated: Secondary | ICD-10-CM | POA: Diagnosis not present

## 2024-01-15 DIAGNOSIS — M25511 Pain in right shoulder: Secondary | ICD-10-CM

## 2024-01-15 DIAGNOSIS — Z23 Encounter for immunization: Secondary | ICD-10-CM

## 2024-01-15 MED ORDER — PREDNISONE 50 MG PO TABS: 50.0000 mg | ORAL_TABLET | Freq: Every day | ORAL | 0 refills | Status: AC

## 2024-01-15 MED ORDER — FLUTICASONE-SALMETEROL 100-50 MCG/ACT IN AEPB
1.0000 | INHALATION_SPRAY | Freq: Two times a day (BID) | RESPIRATORY_TRACT | 3 refills | Status: AC
Start: 2024-01-15 — End: ?

## 2024-01-15 NOTE — Progress Notes (Unsigned)
        Established patient visit   History of Present Illness   Discussed the use of AI scribe software for clinical note transcription with the patient, who gave verbal consent to proceed.  History of Present Illness   Robert Benitez is a 63 year old male who presents with right shoulder pain and asthmatic symptoms.  Right shoulder pain - Sharp, stabbing pain localized around the right shoulder - Occasionally radiates slightly but does not extend to the neck - Pain intensifies with movements such as lifting the arm to the side and reaching across the body - No numbness or tingling in the arm or hand - Heat and ice applied without relief - No use of over-the-counter pain medications  Asthmatic symptoms - Symptoms present over the past few nights, particularly in cold weather - Nocturnal symptoms causing awakening from sleep - Difficulty maintaining a comfortable temperature, alternating between feeling too cold or too hot - Albuterol  rescue inhaler provides relief - Previously used Advair and open to trying other long-acting inhalers - Symptoms were less severe last night     Physical Exam   Physical Exam Vitals reviewed.  Constitutional:      General: He is not in acute distress.    Appearance: Normal appearance. He is not ill-appearing.  HENT:     Head: Normocephalic and atraumatic.  Cardiovascular:     Rate and Rhythm: Normal rate and regular rhythm.     Pulses: Normal pulses.     Heart sounds: Normal heart sounds. No murmur heard.    No friction rub. No gallop.  Pulmonary:     Effort: Pulmonary effort is normal. No respiratory distress.     Breath sounds: Normal breath sounds.  Musculoskeletal:     Right shoulder: Tenderness (posterior and anterior) present. Decreased range of motion.  Skin:    General: Skin is warm and dry.  Neurological:     Mental Status: He is alert and oriented to person, place, and time.  Psychiatric:        Mood and Affect: Mood normal.         Behavior: Behavior normal.        Thought Content: Thought content normal.        Judgment: Judgment normal.    Assessment & Plan     Acute right shoulder pain with possible impingement syndrome Pain due to overuse and inflammation, no tear or severe injury. Decreased range of motion with pain reproducible in office. - Order right shoulder x-ray to assess structural abnormalities. - Prescribe 5-day course of oral steroids to reduce inflammation. - Recommend Aleve or ibuprofen  as needed post-steroid course. - Provide rotator cuff exercises for home to improve range of motion and reduce impingement. - Advise passive range of motion exercises to alleviate stiffness and improve mobility.  Mild persistent asthma, uncomplicated Exacerbation with nocturnal symptoms, managed with albuterol . Previous Advair use noted. Would like to retry a longer acting inhaler for better management. - Prescribe Advair inhaler, one puff twice daily, for long-term management. - Continue prn albuterol .  Follow up   Return in about 6 weeks (around 02/26/2024) for shoulder pain follow up if not better. __________________________________ Zada FREDRIK Palin, DNP, APRN, FNP-BC Primary Care and Sports Medicine [facility]

## 2024-01-17 ENCOUNTER — Ambulatory Visit: Payer: Self-pay | Admitting: Medical-Surgical

## 2024-02-26 ENCOUNTER — Ambulatory Visit: Admitting: Medical-Surgical

## 2024-10-20 ENCOUNTER — Encounter: Admitting: Medical-Surgical
